# Patient Record
Sex: Male | Born: 1965 | Race: White | Hispanic: No | Marital: Married | Smoking: Never smoker
Health system: Southern US, Community
[De-identification: ages and names within clinical notes are randomized; demographics above are authoritative.]

## PROBLEM LIST (undated history)

## (undated) DIAGNOSIS — G4733 Obstructive sleep apnea (adult) (pediatric): Secondary | ICD-10-CM

## (undated) DIAGNOSIS — I10 Essential (primary) hypertension: Secondary | ICD-10-CM

## (undated) HISTORY — PX: LEG SURGERY: SHX1003

## (undated) HISTORY — DX: Essential (primary) hypertension: I10

## (undated) HISTORY — PX: FRACTURE SURGERY: SHX138

## (undated) HISTORY — DX: Obstructive sleep apnea (adult) (pediatric): G47.33

---

## 2002-03-20 ENCOUNTER — Inpatient Hospital Stay (HOSPITAL_COMMUNITY)
Admission: RE | Admit: 2002-03-20 | Discharge: 2002-04-02 | Payer: Self-pay | Admitting: Physical Medicine & Rehabilitation

## 2002-03-21 ENCOUNTER — Encounter: Payer: Self-pay | Admitting: Physical Medicine & Rehabilitation

## 2002-03-26 ENCOUNTER — Encounter: Payer: Self-pay | Admitting: Physical Medicine & Rehabilitation

## 2003-10-13 ENCOUNTER — Encounter: Admission: RE | Admit: 2003-10-13 | Discharge: 2004-01-11 | Payer: Self-pay | Admitting: Internal Medicine

## 2006-06-08 ENCOUNTER — Emergency Department: Payer: Self-pay | Admitting: Emergency Medicine

## 2006-06-08 ENCOUNTER — Other Ambulatory Visit: Payer: Self-pay

## 2008-03-25 ENCOUNTER — Encounter: Payer: Self-pay | Admitting: Endocrinology

## 2008-06-23 ENCOUNTER — Encounter: Payer: Self-pay | Admitting: Endocrinology

## 2008-07-03 ENCOUNTER — Encounter: Payer: Self-pay | Admitting: Endocrinology

## 2008-08-14 ENCOUNTER — Ambulatory Visit: Payer: Self-pay | Admitting: Endocrinology

## 2008-08-14 DIAGNOSIS — E213 Hyperparathyroidism, unspecified: Secondary | ICD-10-CM

## 2008-08-14 LAB — CONVERTED CEMR LAB
Calcium, Total (PTH): 9.7 mg/dL (ref 8.4–10.5)
PTH: 82.4 pg/mL — ABNORMAL HIGH (ref 14.0–72.0)
Prolactin: 4.7 ng/mL
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
## 2015-01-06 ENCOUNTER — Ambulatory Visit (INDEPENDENT_AMBULATORY_CARE_PROVIDER_SITE_OTHER): Payer: BLUE CROSS/BLUE SHIELD | Admitting: Internal Medicine

## 2015-01-06 ENCOUNTER — Encounter: Payer: Self-pay | Admitting: Internal Medicine

## 2015-01-06 VITALS — BP 134/90 | HR 69 | Temp 98.9°F | Wt 356.0 lb

## 2015-01-06 DIAGNOSIS — I10 Essential (primary) hypertension: Secondary | ICD-10-CM | POA: Insufficient documentation

## 2015-01-06 MED ORDER — LISINOPRIL-HYDROCHLOROTHIAZIDE 20-25 MG PO TABS: 1.0000 | ORAL_TABLET | Freq: Every day | ORAL | Status: DC

## 2015-01-06 MED ORDER — AMLODIPINE BESYLATE 5 MG PO TABS: 5.0000 mg | ORAL_TABLET | Freq: Every day | ORAL | Status: DC

## 2015-01-06 NOTE — Patient Instructions (Signed)

## 2015-01-06 NOTE — Assessment & Plan Note (Signed)
Encouraged him to work on diet and exercise 

## 2015-01-06 NOTE — Progress Notes (Signed)
HPI  Pt presents to the clinic today to establish care and for management of the conditions listed below. He is transferring care from Dr. Florene Glen in [place], [place].  Flu: never Tetanus: 2012 Vision Screening: yearly Dentist: yearly   HTN: BP well controlled on Norvasc, Tenormin and Lisinopril-HCT. BP today is 134/90. He is in need of medication refills today. He denies chest pain, chest tightness or shortness of breath.  Past Medical History  Diagnosis Date  . Hypertension     Current Outpatient Prescriptions  Medication Sig Dispense Refill  . amLODipine (NORVASC) 5 MG tablet Take 1 tablet by mouth daily.  0  . atenolol (TENORMIN) 25 MG tablet Take 1 tablet by mouth 2 (two) times daily.  4  . lisinopril-hydrochlorothiazide (PRINZIDE,ZESTORETIC) 20-25 MG per tablet Take 1 tablet by mouth daily.  1   No current facility-administered medications for this visit.    No Known Allergies  Family History  Problem Relation Age of Onset  . Hypertension Mother   . Hypertension Father   . Stroke Father     History   Social History  . Marital Status: Single    Spouse Name: N/A  . Number of Children: N/A  . Years of Education: N/A   Occupational History  . Not on file.   Social History Main Topics  . Smoking status: Never Smoker   . Smokeless tobacco: Never Used  . Alcohol Use: 0.0 oz/week    0 Standard drinks or equivalent per week     Comment: occasional  . Drug Use: No  . Sexual Activity: Not on file   Other Topics Concern  . Not on file   Social History Narrative  . No narrative on file    ROS:  Constitutional: Denies fever, malaise, fatigue, headache or abrupt weight changes.  Respiratory: Denies difficulty breathing, shortness of breath, cough or sputum production.   Cardiovascular: Denies chest pain, chest tightness, palpitations or swelling in the hands or feet.  Neurological: Denies dizziness, difficulty with memory, difficulty with speech or problems with  balance and coordination.   No other specific complaints in a complete review of systems (except as listed in HPI above).  PE:  BP 134/90 mmHg  Pulse 69  Temp(Src) 98.9 F (37.2 C) (Oral)  Wt 356 lb (161.481 kg)  SpO2 98% Wt Readings from Last 3 Encounters:  01/06/15 356 lb (161.481 kg)  08/14/08 387 lb (175.542 kg)    General: Appears his stated age, obese in NAD. HEENT: Head: normal shape and size; Eyes: sclera white, no icterus, conjunctiva pink, PERRLA and EOMs intact; Cardiovascular: Normal rate and rhythm. S1,S2 noted.  No murmur, rubs or gallops noted. No JVD or BLE edema. No carotid bruits noted. Pulmonary/Chest: Normal effort and positive vesicular breath sounds. No respiratory distress. No wheezes, rales or ronchi noted.  Neurological: Alert and oriented.  Psychiatric: Mood and affect normal. Behavior is normal. Judgment and thought content normal.     Assessment and Plan:

## 2015-01-06 NOTE — Assessment & Plan Note (Signed)
Well controlled on current meds Will check CBC and CMET today Medication refilled today  RTC in 6 months to follow up blood pressure

## 2015-01-07 LAB — COMPREHENSIVE METABOLIC PANEL
ALBUMIN: 4.2 g/dL (ref 3.5–5.2)
ALT: 25 U/L (ref 0–53)
AST: 21 U/L (ref 0–37)
Alkaline Phosphatase: 55 U/L (ref 39–117)
BUN: 14 mg/dL (ref 6–23)
CALCIUM: 9.8 mg/dL (ref 8.4–10.5)
CO2: 32 meq/L (ref 19–32)
Chloride: 102 mEq/L (ref 96–112)
Creatinine, Ser: 1.26 mg/dL (ref 0.40–1.50)
GFR: 65.02 mL/min (ref >60.00)
Glucose, Bld: 91 mg/dL (ref 70–99)
POTASSIUM: 3.4 meq/L — AB (ref 3.5–5.1)
SODIUM: 140 meq/L (ref 135–145)
TOTAL PROTEIN: 7.6 g/dL (ref 6.0–8.3)
Total Bilirubin: 0.6 mg/dL (ref 0.2–1.2)

## 2015-01-07 LAB — CBC
HEMATOCRIT: 42.4 % (ref 39.0–52.0)
Hemoglobin: 14.3 g/dL (ref 13.0–17.0)
MCHC: 33.7 g/dL (ref 30.0–36.0)
MCV: 83.2 fl (ref 78.0–100.0)
PLATELETS: 258 10*3/uL (ref 150.0–400.0)
RBC: 5.09 Mil/uL (ref 4.22–5.81)
RDW: 14.3 % (ref 11.5–15.5)
WBC: 5.2 10*3/uL (ref 4.0–10.5)

## 2015-05-12 ENCOUNTER — Other Ambulatory Visit: Payer: Self-pay

## 2015-05-12 MED ORDER — ATENOLOL 25 MG PO TABS: 25.0000 mg | ORAL_TABLET | Freq: Two times a day (BID) | ORAL | Status: DC

## 2015-07-13 ENCOUNTER — Ambulatory Visit: Payer: BLUE CROSS/BLUE SHIELD | Admitting: Internal Medicine

## 2015-07-15 ENCOUNTER — Telehealth: Payer: Self-pay | Admitting: Internal Medicine

## 2015-07-15 NOTE — Telephone Encounter (Signed)
Yes he needs to reschedule

## 2015-07-15 NOTE — Telephone Encounter (Signed)
Pt did not come in for their appt on 07/13/15 for a follow up. Please let me know if pt needs to be contacted immediately for follow up or no follow up needed. Best phone number to contact pt is [phone].

## 2015-07-17 ENCOUNTER — Telehealth: Payer: Self-pay | Admitting: Internal Medicine

## 2015-07-17 NOTE — Telephone Encounter (Signed)
Spoke with patient regarding rescheduling his 6 month follow up that was missed. He has scheduled one for the end of the month. He stated that he never received his lab results from his first appt then forgot about calling us about it. I let him know that a letter was sent to him with the results. He asked if someone could call him and give the the results over the phone. The best number is [phone].

## 2015-07-20 NOTE — Telephone Encounter (Signed)
Left detailed msg on VM per HIPAA  

## 2015-07-21 ENCOUNTER — Other Ambulatory Visit: Payer: Self-pay | Admitting: Internal Medicine

## 2015-07-22 MED ORDER — LISINOPRIL-HYDROCHLOROTHIAZIDE 20-25 MG PO TABS: 1.0000 | ORAL_TABLET | Freq: Every day | ORAL | Status: DC

## 2015-08-03 ENCOUNTER — Telehealth: Payer: Self-pay | Admitting: Internal Medicine

## 2015-08-03 ENCOUNTER — Ambulatory Visit: Payer: BLUE CROSS/BLUE SHIELD | Admitting: Internal Medicine

## 2015-08-03 NOTE — Telephone Encounter (Signed)
Yes, he needs to follow up.

## 2015-08-03 NOTE — Telephone Encounter (Signed)
Pt did not come in for their appt today for follow up. Please let me know if pt needs to be contacted immediately for follow up or no follow up needed. Best phone number to contact pt is [phone].

## 2015-08-06 NOTE — Telephone Encounter (Signed)
Spoke with patient, he currently is on the road working and will call back to reschedule the appt. He has no insurance, he is aware of $80 upfront payment with possible additional fee that will be mailed to him after the 55% discount.

## 2015-09-21 ENCOUNTER — Other Ambulatory Visit: Payer: Self-pay | Admitting: Internal Medicine

## 2015-12-29 ENCOUNTER — Other Ambulatory Visit: Payer: Self-pay | Admitting: Internal Medicine

## 2016-01-25 ENCOUNTER — Other Ambulatory Visit: Payer: Self-pay | Admitting: Internal Medicine

## 2016-03-05 ENCOUNTER — Other Ambulatory Visit: Payer: Self-pay | Admitting: Internal Medicine

## 2016-03-07 ENCOUNTER — Telehealth: Payer: Self-pay | Admitting: Internal Medicine

## 2016-03-07 NOTE — Telephone Encounter (Signed)
Left detailed message on voicemail [facility]) that patient needs to call the office and schedule a follow-up appointment.

## 2016-03-07 NOTE — Telephone Encounter (Signed)
Pt called stating he received a $50 no show bill He stated he called and told something and to cancel appointment and he still received this bill Can anything be done about this

## 2016-03-07 NOTE — Telephone Encounter (Signed)
Pt returned you call.  He stated he would have to wait till he got his bill straightened out.  Sent message to adrienne to help pt with bill

## 2016-03-07 NOTE — Telephone Encounter (Signed)
See phone note. Is it okay to refill medication?

## 2016-03-09 NOTE — Telephone Encounter (Signed)
Rose, Could you work on this please? Thanks!

## 2016-03-14 NOTE — Telephone Encounter (Signed)
It's okay for both this once, but please call the patient and let him know we can't write anymore off if it happens in the future.

## 2016-03-14 NOTE — Telephone Encounter (Signed)
I have taken care of this and notified patient.  Also, let him no we will not be able to write off future no show apptmts.  Thank you!

## 2016-03-14 NOTE — Telephone Encounter (Signed)
Vincente Liberty, Mr. Derrel Nip has TWO no shows in the month of November (07/14/2015 & 08/03/2015), do you want just one written off or both?  Or since he had two in one month do you want any written off? Thank you!!

## 2016-07-14 ENCOUNTER — Other Ambulatory Visit: Payer: Self-pay | Admitting: Internal Medicine

## 2016-10-03 ENCOUNTER — Telehealth: Payer: Self-pay | Admitting: Internal Medicine

## 2016-10-03 NOTE — Telephone Encounter (Signed)
Fine with me. I have only seen him once

## 2016-10-03 NOTE — Telephone Encounter (Signed)
Okay with me if okay with Baity.   Would need OV to discuss colonoscopy.  Thanks.

## 2016-10-03 NOTE — Telephone Encounter (Signed)
Pt says that he would like to switch providers from [facility] to Dr. Damita Dunnings.   Is this switch okay?    ALSO, pt says that he would like to have a colonoscopy.     Please advise.

## 2016-10-11 ENCOUNTER — Encounter: Payer: Self-pay | Admitting: Family Medicine

## 2016-10-11 ENCOUNTER — Ambulatory Visit (INDEPENDENT_AMBULATORY_CARE_PROVIDER_SITE_OTHER): Payer: BC Managed Care – PPO | Admitting: Family Medicine

## 2016-10-11 VITALS — BP 130/90 | HR 74 | Temp 98.5°F | Resp 18 | Ht 73.0 in | Wt 365.0 lb

## 2016-10-11 DIAGNOSIS — Z7189 Other specified counseling: Secondary | ICD-10-CM | POA: Diagnosis not present

## 2016-10-11 DIAGNOSIS — Z9989 Dependence on other enabling machines and devices: Secondary | ICD-10-CM

## 2016-10-11 DIAGNOSIS — I10 Essential (primary) hypertension: Secondary | ICD-10-CM

## 2016-10-11 DIAGNOSIS — G4733 Obstructive sleep apnea (adult) (pediatric): Secondary | ICD-10-CM | POA: Insufficient documentation

## 2016-10-11 LAB — COMPREHENSIVE METABOLIC PANEL
ALBUMIN: 4.5 g/dL (ref 3.5–5.2)
ALK PHOS: 74 U/L (ref 39–117)
ALT: 30 U/L (ref 0–53)
AST: 20 U/L (ref 0–37)
BUN: 11 mg/dL (ref 6–23)
CALCIUM: 9.5 mg/dL (ref 8.4–10.5)
CHLORIDE: 104 meq/L (ref 96–112)
CO2: 30 mEq/L (ref 19–32)
Creatinine, Ser: 1.14 mg/dL (ref 0.40–1.50)
GFR: 72.44 mL/min (ref >60.00)
Glucose, Bld: 111 mg/dL — ABNORMAL HIGH (ref 70–99)
Potassium: 3.9 mEq/L (ref 3.5–5.1)
Sodium: 140 mEq/L (ref 135–145)
TOTAL PROTEIN: 7.4 g/dL (ref 6.0–8.3)
Total Bilirubin: 0.6 mg/dL (ref 0.2–1.2)

## 2016-10-11 LAB — LIPID PANEL
CHOLESTEROL: 191 mg/dL (ref 0–200)
HDL: 62.1 mg/dL (ref >39.00)
LDL Cholesterol: 114 mg/dL — ABNORMAL HIGH (ref 0–99)
NonHDL: 128.41
TRIGLYCERIDES: 73 mg/dL (ref 0.0–149.0)
Total CHOL/HDL Ratio: 3
VLDL: 14.6 mg/dL (ref 0.0–40.0)

## 2016-10-11 MED ORDER — AMLODIPINE BESYLATE 5 MG PO TABS: ORAL_TABLET | ORAL | 3 refills | Status: DC

## 2016-10-11 MED ORDER — LISINOPRIL 20 MG PO TABS: 20.0000 mg | ORAL_TABLET | Freq: Every day | ORAL | 3 refills | Status: DC

## 2016-10-11 MED ORDER — METOPROLOL TARTRATE 25 MG PO TABS: 25.0000 mg | ORAL_TABLET | Freq: Every day | ORAL | 3 refills | Status: DC

## 2016-10-11 NOTE — Assessment & Plan Note (Signed)
Check labs.  D/w pt about diet and exercise, handout given re: low sugar diet, d/w pt.  No change in meds.  Continue work on weight loss.  Continue CPAP for OSA.  >25 minutes spent in face to face time with patient, >50% spent in counselling or coordination of care.

## 2016-10-11 NOTE — Assessment & Plan Note (Signed)
Continue use 

## 2016-10-11 NOTE — Progress Notes (Signed)
Pre visit review using our clinic review tool, if applicable. No additional management support is needed unless otherwise documented below in the visit note. 

## 2016-10-11 NOTE — Patient Instructions (Addendum)
Go to the lab on the way out.  We'll contact you with your lab report. Check to see when you had a tetanus shot.  Let us know.   Low sugar diet, keep exercising.  Take care.  Glad to see you.  I would get a flu shot each fall.

## 2016-10-11 NOTE — Progress Notes (Signed)
Hypertension:    Using medication without problems or lightheadedness: yes Chest pain with exertion:no Edema:no Short of breath:no Average home BPs: usually ~120s/80s Playing weight ~280 lbs with football.  D/w pt about diet and exercise.  Due for labs.   HIV screen prev done, ~1999.  D/w pt.    Flu shot d/w pt.  Encouraged.    H/o OSA on CPAP now, not snoring with that.  Compliant.    Living will d/w pt.  Wife designated if patient were incapacitated, then patient's mother next in line if needed.    Meds, vitals, and allergies reviewed.   PMH and SH reviewed  ROS: Per HPI unless specifically indicated in ROS section   GEN: nad, alert and oriented HEENT: mucous membranes moist NECK: supple w/o LA CV: rrr. PULM: ctab, no inc wob ABD: soft, +bs EXT: no edema SKIN: no acute rash

## 2016-10-13 ENCOUNTER — Other Ambulatory Visit: Payer: Self-pay | Admitting: Family Medicine

## 2016-10-13 ENCOUNTER — Encounter: Payer: Self-pay | Admitting: Family Medicine

## 2016-10-13 DIAGNOSIS — R739 Hyperglycemia, unspecified: Secondary | ICD-10-CM

## 2017-04-12 ENCOUNTER — Other Ambulatory Visit (INDEPENDENT_AMBULATORY_CARE_PROVIDER_SITE_OTHER): Payer: BC Managed Care – PPO

## 2017-04-12 DIAGNOSIS — R739 Hyperglycemia, unspecified: Secondary | ICD-10-CM

## 2017-04-12 LAB — HEMOGLOBIN A1C: HEMOGLOBIN A1C: 6.2 % (ref 4.6–6.5)

## 2017-04-17 ENCOUNTER — Ambulatory Visit (INDEPENDENT_AMBULATORY_CARE_PROVIDER_SITE_OTHER): Payer: BC Managed Care – PPO | Admitting: Family Medicine

## 2017-04-17 ENCOUNTER — Encounter: Payer: Self-pay | Admitting: Family Medicine

## 2017-04-17 VITALS — BP 130/90 | HR 75 | Temp 98.4°F | Wt 365.2 lb

## 2017-04-17 DIAGNOSIS — Z1211 Encounter for screening for malignant neoplasm of colon: Secondary | ICD-10-CM | POA: Diagnosis not present

## 2017-04-17 DIAGNOSIS — R739 Hyperglycemia, unspecified: Secondary | ICD-10-CM

## 2017-04-17 DIAGNOSIS — I10 Essential (primary) hypertension: Secondary | ICD-10-CM | POA: Diagnosis not present

## 2017-04-17 NOTE — Assessment & Plan Note (Signed)
D/w pt about weight loss, initial goal <300 lbs.  Refer to nutrition.  He agrees.

## 2017-04-17 NOTE — Assessment & Plan Note (Signed)
Hyperglycemia.  A1c 6.2. dw pt.  Needs weight loss.  A1c/hyperglycemia path/phys d/w pt.  Refer to nutrition.  Recheck labs prior to CPE in about 6 months.  He agrees.

## 2017-04-17 NOTE — Assessment & Plan Note (Signed)
Continue current meds.  Needs weight loss.

## 2017-04-17 NOTE — Patient Instructions (Signed)
Rosaria Ferries will call about your referral to nutrition.  See if your insurance will cover it.   Think about colon cancer screening- the plain stool blood test, the stool test with blood/DNA testing (cologuard), or a colonoscopy.  See if your insurance will cover the cologuard test.  Let me know if you need help getting the testing set up.   Take care.  Glad to see you.  Keep working on your weight.  Recheck labs prior to a physical in about 6 months.  Update me as needed.

## 2017-04-17 NOTE — Progress Notes (Signed)
Hypertension:    Using medication without problems or lightheadedness: yes Chest pain with exertion:no Edema:not except with prolonged sitting on a cross country drive- that resolved with more activity and diet improvement/less salt.   Short of breath:no  Hyperglycemia.  A1c 6.2. dw pt.  Needs weight loss.  A1c/hyperglycemia path/phys d/w pt.    D/w patient LY:[id number] for colon cancer screening, including IFOB vs. Colonoscopy vs cologuard.  Risks and benefits of both were discussed and patient voiced understanding.  No FH.  Pt elects to consider options.    Meds, vitals, and allergies reviewed.   PMH and SH reviewed  ROS: Per HPI unless specifically indicated in ROS section   GEN: nad, alert and oriented HEENT: mucous membranes moist NECK: supple w/o LA CV: rrr. PULM: ctab, no inc wob ABD: soft, +bs EXT: no edema

## 2017-04-17 NOTE — Assessment & Plan Note (Signed)
D/w patient Steven Blankenship for colon cancer screening, including IFOB vs. Colonoscopy vs cologuard.  Risks and benefits of both were discussed and patient voiced understanding.  No FH.  Pt elects to consider options.   He'll update me, see AVS.

## 2017-06-21 ENCOUNTER — Telehealth: Payer: Self-pay | Admitting: Family Medicine
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
## 2017-06-21 NOTE — Telephone Encounter (Signed)
Noted, please get him in for UC follow up after evaluated.

## 2017-06-21 NOTE — Telephone Encounter (Signed)
I spoke with pt and he is waiting on his wife to take him to UC today.

## 2017-06-27 ENCOUNTER — Telehealth: Payer: Self-pay | Admitting: Family Medicine

## 2017-06-27 NOTE — Telephone Encounter (Signed)
See notes in EMR. I thought he went to urgent care. Please see what detail she can get from patient. Thanks.

## 2017-06-27 NOTE — Telephone Encounter (Signed)
Copied from [place] #[id number]. Topic: Referral - Status >> Jun 26, 2017 11:26 AM Steven Blankenship wrote: Reason for CRM: spoke with office last week on a referral for orthopedic doctor. Wanting to know what doc he needs to schedule an appt with.

## 2017-06-28 NOTE — Telephone Encounter (Addendum)
Spoke to patient and was advised that he did not call this office this week. Patient stated that he went to [facility] and they referred him to an orthopedic doctor. Patient stated that he does not know why we got this message. Patient stated that [facility] walk-in clinic referred him to an orthopedist. Patient stated that they scheduled him an appointment with ortho on Wednesday. Patient stated that he called the number that was listed on his discharge paper for the orthopedist office Monday to tell them he needed to reschedule his appointment for Wednesday. Patient stated that their call center told him that they would have the orthopedist office call him back. Patient stated that he never got a call back from them. See other phone note.

## 2017-06-28 NOTE — Telephone Encounter (Signed)
Spoke to patient and was advised that he went to the walk-in clinic at [facility] clinic and they referred him to an orthopedist. Patient stated that he called the number on his discharge paper Monday to let them know that he could not make the appointment scheduled Wednesday with the orthopedist and no one called him back. Patients stated that he was working today and could not make that appointment and that is why he called Monday to reschedule.  Patient stated that his appointment was scheduled with a doctor that is located on [facility].. Patient stated that he called Monday and they were suppose to have the orthopedist office call him back.  Monday.[place]. Patient stated that he was referred because of his shoulder.

## 2017-06-29 NOTE — Telephone Encounter (Signed)
Patient stated that he is still trying to get in contact with them.

## 2017-06-29 NOTE — Telephone Encounter (Signed)
I don't think there is anything for [place] to do at this point. I would have him contact the UC or the ortho clinic about the referral.  If he still can't get set up, then let us know.  Thanks.

## 2017-08-01 ENCOUNTER — Telehealth: Payer: Self-pay | Admitting: Family Medicine

## 2017-08-01 NOTE — Telephone Encounter (Signed)
Called [facility] to check on referral that was placed in August. Patient scheduled,then cancelled,rescheduled then cancelled . He told [facility] he was not interested in the classes. I am cancelling the referral.

## 2017-08-02 NOTE — Telephone Encounter (Signed)
Noted. Thanks for trying.

## 2017-11-13 ENCOUNTER — Ambulatory Visit: Payer: BLUE CROSS/BLUE SHIELD | Admitting: Family Medicine

## 2017-11-13 ENCOUNTER — Encounter: Payer: Self-pay | Admitting: Family Medicine

## 2017-11-13 VITALS — BP 116/84 | HR 91 | Temp 98.7°F | Wt 367.2 lb

## 2017-11-13 DIAGNOSIS — I10 Essential (primary) hypertension: Secondary | ICD-10-CM

## 2017-11-13 DIAGNOSIS — Z8249 Family history of ischemic heart disease and other diseases of the circulatory system: Secondary | ICD-10-CM

## 2017-11-13 DIAGNOSIS — Z9989 Dependence on other enabling machines and devices: Secondary | ICD-10-CM | POA: Diagnosis not present

## 2017-11-13 DIAGNOSIS — J069 Acute upper respiratory infection, unspecified: Secondary | ICD-10-CM | POA: Diagnosis not present

## 2017-11-13 DIAGNOSIS — G4733 Obstructive sleep apnea (adult) (pediatric): Secondary | ICD-10-CM

## 2017-11-13 DIAGNOSIS — R739 Hyperglycemia, unspecified: Secondary | ICD-10-CM | POA: Diagnosis not present

## 2017-11-13 LAB — COMPREHENSIVE METABOLIC PANEL
ALBUMIN: 4.2 g/dL (ref 3.5–5.2)
ALT: 43 U/L (ref 0–53)
AST: 26 U/L (ref 0–37)
Alkaline Phosphatase: 56 U/L (ref 39–117)
BUN: 12 mg/dL (ref 6–23)
CALCIUM: 9.6 mg/dL (ref 8.4–10.5)
CHLORIDE: 104 meq/L (ref 96–112)
CO2: 30 meq/L (ref 19–32)
Creatinine, Ser: 1.03 mg/dL (ref 0.40–1.50)
GFR: 81.08 mL/min (ref >60.00)
Glucose, Bld: 99 mg/dL (ref 70–99)
Potassium: 3.7 mEq/L (ref 3.5–5.1)
Sodium: 139 mEq/L (ref 135–145)
Total Bilirubin: 0.6 mg/dL (ref 0.2–1.2)
Total Protein: 7.2 g/dL (ref 6.0–8.3)

## 2017-11-13 LAB — HEMOGLOBIN A1C: Hgb A1c MFr Bld: 6.1 % (ref 4.6–6.5)

## 2017-11-13 LAB — LIPID PANEL
CHOL/HDL RATIO: 3
CHOLESTEROL: 170 mg/dL (ref 0–200)
HDL: 57.5 mg/dL (ref >39.00)
LDL Cholesterol: 96 mg/dL (ref 0–99)
NonHDL: 112.22
TRIGLYCERIDES: 81 mg/dL (ref 0.0–149.0)
VLDL: 16.2 mg/dL (ref 0.0–40.0)

## 2017-11-13 MED ORDER — LISINOPRIL 20 MG PO TABS: 20.0000 mg | ORAL_TABLET | Freq: Every day | ORAL | 3 refills | Status: DC

## 2017-11-13 MED ORDER — METOPROLOL TARTRATE 25 MG PO TABS: 25.0000 mg | ORAL_TABLET | Freq: Every day | ORAL | 3 refills | Status: DC

## 2017-11-13 MED ORDER — AMLODIPINE BESYLATE 5 MG PO TABS: ORAL_TABLET | ORAL | 3 refills | Status: DC

## 2017-11-13 MED ORDER — ALBUTEROL SULFATE HFA 108 (90 BASE) MCG/ACT IN AERS: 1.0000 | INHALATION_SPRAY | Freq: Four times a day (QID) | RESPIRATORY_TRACT | 1 refills | Status: DC | PRN

## 2017-11-13 MED ORDER — ALBUTEROL SULFATE HFA 108 (90 BASE) MCG/ACT IN AERS: 1.0000 | INHALATION_SPRAY | Freq: Four times a day (QID) | RESPIRATORY_TRACT | Status: DC | PRN

## 2017-11-13 NOTE — Patient Instructions (Signed)
Go to the lab on the way out.  We'll contact you with your lab report. Schedule a physical for the fall.  Update me as needed.  Take care.  Glad to see you.

## 2017-11-13 NOTE — Progress Notes (Signed)
Hypertension:    Using medication without problems or lightheadedness: yes Chest pain with exertion:no Edema:no Short of breath: no Other issues: still on CPAP for OSA at baseline until recent URI vs allergy sx.  Cough, sputum, runny nose.  Some occ wheeze.  Had used SABA with some help with the wheeze.  Drinking plenty of fluids.  No fever.  He feels some better now.   He has CPAP to restart when possible.    Flu shot declined.    D/w pt about weight, he is cutting out carbs.  He had L shoulder injury with lifting and that affected his exercise.  He is getting back to using a treadmill.  His shoulder is better.  He is back on his diet.    His brother had a DVT.  His brother is a driver and wasn't getting up to [employer].  Patient does get up and move around with scheduled breaks.  Patient has no personal history of DVT.  No other FH.   Discussed with patient, it would not appear that patient needs hypercoagulable workup at this point.  Meds, vitals, and allergies reviewed.   PMH and SH reviewed  ROS: Per HPI unless specifically indicated in ROS section   GEN: nad, alert and oriented HEENT: mucous membranes moist, tm w/o erythema, nasal exam w/o erythema, clear discharge noted,  OP with cobblestoning NECK: supple w/o LA CV: rrr.   PULM: ctab, no inc wob EXT: no edema SKIN: no acute rash  Labs pending.

## 2017-11-14 ENCOUNTER — Encounter: Payer: Self-pay | Admitting: Family Medicine

## 2017-11-14 DIAGNOSIS — Z8249 Family history of ischemic heart disease and other diseases of the circulatory system: Secondary | ICD-10-CM | POA: Insufficient documentation

## 2017-11-14 DIAGNOSIS — J069 Acute upper respiratory infection, unspecified: Secondary | ICD-10-CM | POA: Insufficient documentation

## 2017-11-14 NOTE — Assessment & Plan Note (Signed)
Continue current medications.  Check routine labs today.  Discussed with patient about diet and exercise.  See notes and labs.

## 2017-11-14 NOTE — Assessment & Plan Note (Signed)
Discussed with patient about diet and exercise.  See above.

## 2017-11-14 NOTE — Assessment & Plan Note (Signed)
Restart CPAP.  Had been compliant.  Only stopped use when he had recent URI symptoms.  Discussed.

## 2017-11-14 NOTE — Assessment & Plan Note (Signed)
His brother had a DVT.  His brother is a driver and wasn't getting up to [employer].  Patient does get up and move around with scheduled breaks.  Patient has no personal history of DVT.  No other FH.   Discussed with patient, it would not appear that patient needs hypercoagulable workup at this point.

## 2017-11-14 NOTE — Assessment & Plan Note (Signed)
Likely viral, improving.  Nontoxic.  Okay for outpatient follow-up.  Lungs are clear.  Restart CPAP.  No need for antibiotics.  Rationale discussed with patient.

## 2017-11-28 ENCOUNTER — Telehealth: Payer: Self-pay | Admitting: Family Medicine

## 2017-11-28 NOTE — Telephone Encounter (Signed)
Form transported to Dr. Josefine Class In [place].

## 2017-11-28 NOTE — Telephone Encounter (Signed)
Form faxed and scanned.

## 2017-11-28 NOTE — Telephone Encounter (Signed)
Form done.  Thanks.  Please scan the papers before given to patient.

## 2017-11-28 NOTE — Telephone Encounter (Signed)
Pt dropped off form of medical necessity for his CPAP machine. Needs form filled out ASAP. Placed in Rx tower.

## 2018-04-08 ENCOUNTER — Other Ambulatory Visit: Payer: Self-pay | Admitting: Family Medicine

## 2018-04-08 DIAGNOSIS — R739 Hyperglycemia, unspecified: Secondary | ICD-10-CM

## 2018-04-08 DIAGNOSIS — I10 Essential (primary) hypertension: Secondary | ICD-10-CM

## 2018-04-11 ENCOUNTER — Other Ambulatory Visit: Payer: Self-pay

## 2018-04-16 ENCOUNTER — Encounter: Payer: BLUE CROSS/BLUE SHIELD | Admitting: Family Medicine

## 2018-05-22 ENCOUNTER — Emergency Department: Payer: BLUE CROSS/BLUE SHIELD

## 2018-05-22 ENCOUNTER — Emergency Department
Admission: EM | Admit: 2018-05-22 | Discharge: 2018-05-22 | Disposition: A | Discharge status: Home / Self Care | Payer: BLUE CROSS/BLUE SHIELD | Attending: Emergency Medicine | Admitting: Emergency Medicine

## 2018-05-22 ENCOUNTER — Other Ambulatory Visit: Payer: Self-pay

## 2018-05-22 DIAGNOSIS — S3992XA Unspecified injury of lower back, initial encounter: Secondary | ICD-10-CM | POA: Diagnosis present

## 2018-05-22 DIAGNOSIS — Y998 Other external cause status: Secondary | ICD-10-CM | POA: Insufficient documentation

## 2018-05-22 DIAGNOSIS — Y939 Activity, unspecified: Secondary | ICD-10-CM | POA: Insufficient documentation

## 2018-05-22 DIAGNOSIS — I10 Essential (primary) hypertension: Secondary | ICD-10-CM | POA: Diagnosis not present

## 2018-05-22 DIAGNOSIS — W08XXXA Fall from other furniture, initial encounter: Secondary | ICD-10-CM | POA: Diagnosis not present

## 2018-05-22 DIAGNOSIS — Y929 Unspecified place or not applicable: Secondary | ICD-10-CM | POA: Diagnosis not present

## 2018-05-22 DIAGNOSIS — Z79899 Other long term (current) drug therapy: Secondary | ICD-10-CM | POA: Diagnosis not present

## 2018-05-22 DIAGNOSIS — S300XXA Contusion of lower back and pelvis, initial encounter: Secondary | ICD-10-CM | POA: Diagnosis not present

## 2018-05-22 MED ORDER — TRAMADOL HCL 50 MG PO TABS: 50.0000 mg | ORAL_TABLET | Freq: Four times a day (QID) | ORAL | 0 refills | Status: DC | PRN

## 2018-05-22 MED ORDER — CYCLOBENZAPRINE HCL 10 MG PO TABS: 10.0000 mg | ORAL_TABLET | Freq: Three times a day (TID) | ORAL | 0 refills | Status: DC | PRN

## 2018-05-22 NOTE — ED Triage Notes (Signed)
Pt arrives via POV. Pt states fell off stool on sunday hitting head on concrete. Pt denies any dizziness, nausea, or visual changes. Pt states head currently not hurting and right lower back is pain/stiff is main concern. NAD noted at this time

## 2018-05-22 NOTE — Discharge Instructions (Signed)
Follow discharge care instruction take medication as directed. °
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
## 2018-06-18 ENCOUNTER — Telehealth: Payer: Self-pay

## 2018-06-18 DIAGNOSIS — Z1211 Encounter for screening for malignant neoplasm of colon: Secondary | ICD-10-CM

## 2018-06-18 NOTE — Telephone Encounter (Signed)
Pt last seen 11/13/17 for med refill. Per health maintenance colonoscopy is overdue since 05/11/17.

## 2018-06-18 NOTE — Telephone Encounter (Signed)
Copied from [place] [id number]. Topic: General - Other >> Jun 18, 2018  1:11 PM Cecelia Byars, NT wrote: Reason for CRM: Patient says he had a screening  for a colonoscopy and would like to reschedule so he can schedule  the procedure  please call him at [phone] to give him the information.

## 2018-06-19 NOTE — Telephone Encounter (Signed)
I spoke with pt; pt said he was scheduled for colonoscopy on 06/04/18 and could not go for that appt; pt cancelled appt and was supposed to cb to reschedule. Pt does not know who called to schedule the colonoscopy appt; pt thought was [facility] but not sure. Pt per health maintenance was due colonoscopy in 2018. Pt is not having any problems or symptoms. Pt request colonoscopy to be done in [place].Please advise.

## 2018-06-19 NOTE — Telephone Encounter (Signed)
Please talk to me about this.  Please help me understand the situation.  Thanks.

## 2018-06-20 NOTE — Telephone Encounter (Signed)
Referral ordered.  Thanks

## 2018-06-20 NOTE — Telephone Encounter (Signed)
Tried to call patient and unable to leave message because mail box was full. Will have to try and call again later.

## 2018-06-21 ENCOUNTER — Other Ambulatory Visit: Payer: Self-pay

## 2018-06-21 DIAGNOSIS — Z1211 Encounter for screening for malignant neoplasm of colon: Secondary | ICD-10-CM

## 2018-06-22 ENCOUNTER — Other Ambulatory Visit: Payer: Self-pay

## 2018-06-22 NOTE — Telephone Encounter (Signed)
Spoke with patient and verified he has all information needed and appointment has been set up for 07/16/18.

## 2018-07-09 ENCOUNTER — Other Ambulatory Visit: Payer: Self-pay

## 2018-07-09 MED ORDER — PEG 3350-KCL-NA BICARB-NACL 420 G PO SOLR: 4000.0000 mL | Freq: Once | ORAL | 0 refills | Status: AC

## 2018-07-16 ENCOUNTER — Encounter: Payer: Self-pay | Admitting: *Deleted

## 2018-07-16 ENCOUNTER — Encounter
Admission: RE | Disposition: A | Discharge status: Home / Self Care | Payer: Self-pay | Source: Ambulatory Visit | Attending: Gastroenterology

## 2018-07-16 ENCOUNTER — Ambulatory Visit: Payer: BLUE CROSS/BLUE SHIELD | Admitting: Certified Registered Nurse Anesthetist

## 2018-07-16 ENCOUNTER — Ambulatory Visit
Admission: RE | Admit: 2018-07-16 | Discharge: 2018-07-16 | Disposition: A | Discharge status: Home / Self Care | Payer: BLUE CROSS/BLUE SHIELD | Source: Ambulatory Visit | Attending: Gastroenterology | Admitting: Gastroenterology

## 2018-07-16 DIAGNOSIS — Z79899 Other long term (current) drug therapy: Secondary | ICD-10-CM | POA: Diagnosis not present

## 2018-07-16 DIAGNOSIS — K648 Other hemorrhoids: Secondary | ICD-10-CM | POA: Insufficient documentation

## 2018-07-16 DIAGNOSIS — Z1211 Encounter for screening for malignant neoplasm of colon: Secondary | ICD-10-CM

## 2018-07-16 DIAGNOSIS — D122 Benign neoplasm of ascending colon: Secondary | ICD-10-CM | POA: Diagnosis not present

## 2018-07-16 DIAGNOSIS — J449 Chronic obstructive pulmonary disease, unspecified: Secondary | ICD-10-CM | POA: Diagnosis not present

## 2018-07-16 DIAGNOSIS — I10 Essential (primary) hypertension: Secondary | ICD-10-CM | POA: Insufficient documentation

## 2018-07-16 DIAGNOSIS — K621 Rectal polyp: Secondary | ICD-10-CM | POA: Diagnosis not present

## 2018-07-16 DIAGNOSIS — Z6841 Body Mass Index (BMI) 40.0 and over, adult: Secondary | ICD-10-CM | POA: Diagnosis not present

## 2018-07-16 DIAGNOSIS — G4733 Obstructive sleep apnea (adult) (pediatric): Secondary | ICD-10-CM | POA: Insufficient documentation

## 2018-07-16 HISTORY — PX: COLONOSCOPY WITH PROPOFOL: SHX5780

## 2018-07-16 SURGERY — COLONOSCOPY WITH PROPOFOL
Anesthesia: General

## 2018-07-16 MED ORDER — METOPROLOL SUCCINATE ER 25 MG PO TB24
ORAL_TABLET | ORAL | Status: AC
  Administered 2018-07-16: 25 mg via ORAL
  Filled 2018-07-16: qty 1

## 2018-07-16 MED ORDER — SODIUM CHLORIDE 0.9 % IV SOLN
INTRAVENOUS | Status: DC
  Administered 2018-07-16: 1000 mL via INTRAVENOUS

## 2018-07-16 MED ORDER — PROPOFOL 10 MG/ML IV BOLUS
INTRAVENOUS | Status: DC | PRN
  Administered 2018-07-16: 40 mg via INTRAVENOUS
  Administered 2018-07-16: 100 mg via INTRAVENOUS

## 2018-07-16 MED ORDER — PROPOFOL 500 MG/50ML IV EMUL
INTRAVENOUS | Status: DC | PRN
  Administered 2018-07-16: 160 ug/kg/min via INTRAVENOUS

## 2018-07-16 MED ORDER — METOPROLOL TARTRATE 25 MG PO TABS: 25.0000 mg | ORAL_TABLET | ORAL | Status: DC

## 2018-07-16 NOTE — Anesthesia Post-op Follow-up Note (Signed)
Anesthesia QCDR form completed.        

## 2018-07-16 NOTE — Transfer of Care (Signed)
Immediate Anesthesia Transfer of Care Note  Patient: Colon Flattery III  Procedure(s) Performed: COLONOSCOPY WITH PROPOFOL (N/A )  Patient Location: PACU  Anesthesia Type:General  Level of Consciousness: drowsy  Airway & Oxygen Therapy: Patient Spontanous Breathing and Patient connected to nasal cannula oxygen  Post-op Assessment: Report given to RN and Post -op Vital signs reviewed and stable  Post vital signs: Reviewed and stable  Last Vitals:  Vitals Value Taken Time  BP 148/112 07/16/2018 11:13 AM  Temp    Pulse 81 07/16/2018 11:15 AM  Resp 18 07/16/2018 11:15 AM  SpO2 99 % 07/16/2018 11:15 AM  Vitals shown include unvalidated device data.  Last Pain:  Vitals:   07/16/18 1104  TempSrc:   PainSc: 0-No pain      Patients Stated Pain Goal: 0 (37/90/24 0973)  Complications: None
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
## 2018-07-16 NOTE — Op Note (Signed)
[facility] Patient Name: Steven Blankenship [facility] Procedure Date: 07/16/2018 10:17 AM MRN: [id number] Account #: [id number] Date of Birth: 05-09-1966 Admit Type: Outpatient Age: 52 Room: [facility] ENDO ROOM 4 Gender: Male Note Status: Finalized Procedure:            Colonoscopy Indications:          Screening for colorectal malignant neoplasm, This is                        the patient's first colonoscopy Providers:            Lin Landsman MD, MD Referring MD:         Elveria Rising. Damita Dunnings, MD (Referring MD) Medicines:            Monitored Anesthesia Care Complications:        No immediate complications. Estimated blood loss: None. Procedure:            Pre-Anesthesia Assessment:                       - Prior to the procedure, a History and Physical was                        performed, and patient medications and allergies were                        reviewed. The patient is competent. The risks and                        benefits of the procedure and the sedation options and                        risks were discussed with the patient. All questions                        were answered and informed consent was obtained.                        Patient identification and proposed procedure were                        verified by the physician, the nurse, the                        anesthesiologist, the anesthetist and the technician in                        the pre-procedure area in the procedure room in the                        endoscopy suite. Mental Status Examination: alert and                        oriented. Airway Examination: normal oropharyngeal                        airway and neck mobility. Respiratory Examination:                        clear to auscultation. CV Examination: normal.  Prophylactic Antibiotics: The patient does not require                        prophylactic antibiotics. Prior Anticoagulants: The                patient has taken aspirin, last dose was 2 days prior                        to procedure. ASA Grade Assessment: III - A patient                        with severe systemic disease. After reviewing the risks                        and benefits, the patient was deemed in satisfactory                        condition to undergo the procedure. The anesthesia plan                        was to use monitored anesthesia care (MAC). Immediately                        prior to administration of medications, the patient was                        re-assessed for adequacy to receive sedatives. The                        heart rate, respiratory rate, oxygen saturations, blood                        pressure, adequacy of pulmonary ventilation, and                        response to care were monitored throughout the                        procedure. The physical status of the patient was                        re-assessed after the procedure.                       After obtaining informed consent, the colonoscope was                        passed under direct vision. Throughout the procedure,                        the patient's blood pressure, pulse, and oxygen                        saturations were monitored continuously. The                        Colonoscope was introduced through the anus and                        advanced to the the cecum, identified  by appendiceal                        orifice and ileocecal valve. The colonoscopy was                        performed with moderate difficulty due to significant                        looping and the patient's body habitus. Successful                        completion of the procedure was aided by changing the                        patient to a prone position and applying abdominal                        pressure. The patient tolerated the procedure well. The                        quality of the bowel preparation was evaluated  using                        the BBPS [facility] Bowel Preparation Scale) with scores                        of: Right Colon = 3, Transverse Colon = 3 and Left                        Colon = 3 (entire mucosa seen well with no residual                        staining, small fragments of stool or opaque liquid).                        The total BBPS score equals 9. Findings:      The perianal and digital rectal examinations were normal. Pertinent       negatives include normal sphincter tone and no palpable rectal lesions.      A 6 mm polyp was found in the ascending colon. The polyp was sessile.       The polyp was removed with a cold snare. Resection and retrieval were       complete.      A diminutive polyp was found in the rectum. The polyp was sessile. The       polyp was removed with a cold biopsy forceps. Resection and retrieval       were complete.      Non-bleeding internal hemorrhoids were found during retroflexion. The       hemorrhoids were moderate. Impression:           - One 6 mm polyp in the ascending colon, removed with a                        cold snare. Resected and retrieved.                       - One diminutive polyp in the rectum, removed with a  cold biopsy forceps. Resected and retrieved.                       - Non-bleeding internal hemorrhoids. Recommendation:       - Discharge patient to home (with escort).                       - Resume previous diet today.                       - Continue present medications.                       - Await pathology results.                       - Repeat colonoscopy in 5-10 years for surveillance                        based on pathology results. Procedure Code(s):    --- Professional ---                       [phone], Colonoscopy, flexible; with removal of tumor(s),                        polyp(s), or other lesion(s) by snare technique                       45380, 89, Colonoscopy, flexible; with biopsy,  single                        or multiple Diagnosis Code(s):    --- Professional ---                       Z12.11, Encounter for screening for malignant neoplasm                        of colon                       D12.2, Benign neoplasm of ascending colon                       K62.1, Rectal polyp                       K64.8, Other hemorrhoids CPT copyright 2018 American Medical Association. All rights reserved. The codes documented in this report are preliminary and upon coder review may  be revised to meet current compliance requirements. Dr. Ulyess Mort Lin Landsman MD, MD 07/16/2018 10:51:59 AM This report has been signed electronically. Number of Addenda: 0 Note Initiated On: 07/16/2018 10:17 AM Scope Withdrawal Time: 0 hours 18 minutes 17 seconds  Total Procedure Duration: 0 hours 24 minutes 21 seconds       [facility]

## 2018-07-16 NOTE — Anesthesia Postprocedure Evaluation (Signed)
Anesthesia Post Note  Patient: Steven Blankenship  Procedure(s) Performed: COLONOSCOPY WITH PROPOFOL (N/A )  Patient location during evaluation: Endoscopy Anesthesia Type: General Level of consciousness: awake and alert Pain management: pain level controlled Vital Signs Assessment: post-procedure vital signs reviewed and stable Respiratory status: spontaneous breathing and respiratory function stable Cardiovascular status: stable Anesthetic complications: no     Last Vitals:  Vitals:   07/16/18 1054 07/16/18 1104  BP: 129/76 (!) 138/95  Pulse: 87 84  Resp: 17 17  Temp: (!) 36.1 C   SpO2: 100% 100%    Last Pain:  Vitals:   07/16/18 1104  TempSrc:   PainSc: 0-No pain                 KEPHART,WILLIAM K

## 2018-07-16 NOTE — Anesthesia Procedure Notes (Signed)
Performed by: Ahkeem Goede, CRNA Pre-anesthesia Checklist: Patient identified, Emergency Drugs available, Suction available, Patient being monitored and Timeout performed Patient Re-evaluated:Patient Re-evaluated prior to induction Oxygen Delivery Method: Nasal cannula Induction Type: IV induction       

## 2018-07-16 NOTE — Anesthesia Preprocedure Evaluation (Signed)
Anesthesia Evaluation  Patient identified by MRN, date of birth, ID band Patient awake    Reviewed: Allergy & Precautions, NPO status , Patient's Chart, lab work & pertinent test results  History of Anesthesia Complications Negative for: history of anesthetic complications  Airway Mallampati: II       Dental   Pulmonary sleep apnea and Continuous Positive Airway Pressure Ventilation , COPD,  COPD inhaler,           Cardiovascular hypertension, Pt. on medications and Pt. on home beta blockers (-) Past MI and (-) CHF (-) dysrhythmias (-) Valvular Problems/Murmurs     Neuro/Psych neg Seizures    GI/Hepatic Neg liver ROS, neg GERD  ,  Endo/Other  neg diabetesMorbid obesity  Renal/GU negative Renal ROS     Musculoskeletal   Abdominal   Peds  Hematology   Anesthesia Other Findings   Reproductive/Obstetrics                             Anesthesia Physical Anesthesia Plan  ASA: III  Anesthesia Plan: General   Post-op Pain Management:    Induction: Intravenous  PONV Risk Score and Plan: 2  Airway Management Planned: Nasal Cannula  Additional Equipment:   Intra-op Plan:   Post-operative Plan:   Informed Consent: I have reviewed the patients History and Physical, chart, labs and discussed the procedure including the risks, benefits and alternatives for the proposed anesthesia with the patient or authorized representative who has indicated his/her understanding and acceptance.     Plan Discussed with:   Anesthesia Plan Comments:         Anesthesia Quick Evaluation

## 2018-07-17 ENCOUNTER — Encounter: Payer: Self-pay | Admitting: Gastroenterology

## 2018-07-18 ENCOUNTER — Encounter: Payer: Self-pay | Admitting: Gastroenterology

## 2018-07-18 LAB — SURGICAL PATHOLOGY

## 2018-10-11 ENCOUNTER — Encounter: Payer: Self-pay | Admitting: *Deleted

## 2018-11-12 ENCOUNTER — Encounter: Payer: BLUE CROSS/BLUE SHIELD | Admitting: Family Medicine

## 2018-11-29 ENCOUNTER — Other Ambulatory Visit: Payer: Self-pay | Admitting: Family Medicine

## 2018-12-03 ENCOUNTER — Emergency Department (HOSPITAL_COMMUNITY): Payer: BLUE CROSS/BLUE SHIELD

## 2018-12-03 ENCOUNTER — Inpatient Hospital Stay (HOSPITAL_COMMUNITY)
Admission: EM | Admit: 2018-12-03 | Discharge: 2018-12-07 | DRG: 089 | Disposition: A | Discharge status: Rehabilitation Facility | Payer: BLUE CROSS/BLUE SHIELD | Attending: General Surgery | Admitting: General Surgery

## 2018-12-03 ENCOUNTER — Encounter (HOSPITAL_COMMUNITY): Payer: Self-pay | Admitting: Emergency Medicine

## 2018-12-03 ENCOUNTER — Other Ambulatory Visit: Payer: Self-pay

## 2018-12-03 DIAGNOSIS — E46 Unspecified protein-calorie malnutrition: Secondary | ICD-10-CM | POA: Diagnosis not present

## 2018-12-03 DIAGNOSIS — M25561 Pain in right knee: Secondary | ICD-10-CM | POA: Diagnosis not present

## 2018-12-03 DIAGNOSIS — G4733 Obstructive sleep apnea (adult) (pediatric): Secondary | ICD-10-CM | POA: Diagnosis present

## 2018-12-03 DIAGNOSIS — Z6836 Body mass index (BMI) 36.0-36.9, adult: Secondary | ICD-10-CM

## 2018-12-03 DIAGNOSIS — R402412 Glasgow coma scale score 13-15, at arrival to emergency department: Secondary | ICD-10-CM | POA: Diagnosis present

## 2018-12-03 DIAGNOSIS — R7303 Prediabetes: Secondary | ICD-10-CM | POA: Diagnosis present

## 2018-12-03 DIAGNOSIS — S93402A Sprain of unspecified ligament of left ankle, initial encounter: Secondary | ICD-10-CM | POA: Diagnosis present

## 2018-12-03 DIAGNOSIS — E669 Obesity, unspecified: Secondary | ICD-10-CM | POA: Diagnosis present

## 2018-12-03 DIAGNOSIS — S2241XA Multiple fractures of ribs, right side, initial encounter for closed fracture: Secondary | ICD-10-CM | POA: Diagnosis present

## 2018-12-03 DIAGNOSIS — S2221XA Fracture of manubrium, initial encounter for closed fracture: Secondary | ICD-10-CM | POA: Diagnosis present

## 2018-12-03 DIAGNOSIS — S069X9S Unspecified intracranial injury with loss of consciousness of unspecified duration, sequela: Secondary | ICD-10-CM | POA: Diagnosis not present

## 2018-12-03 DIAGNOSIS — N289 Disorder of kidney and ureter, unspecified: Secondary | ICD-10-CM | POA: Diagnosis present

## 2018-12-03 DIAGNOSIS — R0902 Hypoxemia: Secondary | ICD-10-CM | POA: Diagnosis present

## 2018-12-03 DIAGNOSIS — S27322A Contusion of lung, bilateral, initial encounter: Secondary | ICD-10-CM | POA: Diagnosis present

## 2018-12-03 DIAGNOSIS — S060X9D Concussion with loss of consciousness of unspecified duration, subsequent encounter: Secondary | ICD-10-CM | POA: Diagnosis not present

## 2018-12-03 DIAGNOSIS — S060X9A Concussion with loss of consciousness of unspecified duration, initial encounter: Secondary | ICD-10-CM | POA: Diagnosis present

## 2018-12-03 DIAGNOSIS — I10 Essential (primary) hypertension: Secondary | ICD-10-CM | POA: Diagnosis present

## 2018-12-03 DIAGNOSIS — M542 Cervicalgia: Secondary | ICD-10-CM

## 2018-12-03 DIAGNOSIS — S069X9A Unspecified intracranial injury with loss of consciousness of unspecified duration, initial encounter: Secondary | ICD-10-CM | POA: Diagnosis not present

## 2018-12-03 DIAGNOSIS — D62 Acute posthemorrhagic anemia: Secondary | ICD-10-CM | POA: Diagnosis not present

## 2018-12-03 DIAGNOSIS — R740 Nonspecific elevation of levels of transaminase and lactic acid dehydrogenase [LDH]: Secondary | ICD-10-CM | POA: Diagnosis not present

## 2018-12-03 DIAGNOSIS — M25562 Pain in left knee: Secondary | ICD-10-CM | POA: Diagnosis not present

## 2018-12-03 DIAGNOSIS — T07XXXA Unspecified multiple injuries, initial encounter: Secondary | ICD-10-CM | POA: Diagnosis not present

## 2018-12-03 LAB — COMPREHENSIVE METABOLIC PANEL
ALBUMIN: 4 g/dL (ref 3.5–5.0)
ALT: 73 U/L — ABNORMAL HIGH (ref 0–44)
ANION GAP: 11 (ref 5–15)
AST: 106 U/L — ABNORMAL HIGH (ref 15–41)
Alkaline Phosphatase: 64 U/L (ref 38–126)
BUN: 12 mg/dL (ref 6–20)
CALCIUM: 9.3 mg/dL (ref 8.9–10.3)
CO2: 24 mmol/L (ref 22–32)
Chloride: 105 mmol/L (ref 98–111)
Creatinine, Ser: 1.42 mg/dL — ABNORMAL HIGH (ref 0.61–1.24)
GFR calc non Af Amer: 57 mL/min — ABNORMAL LOW (ref >60)
GLUCOSE: 133 mg/dL — AB (ref 70–99)
POTASSIUM: 4.3 mmol/L (ref 3.5–5.1)
SODIUM: 140 mmol/L (ref 135–145)
Total Bilirubin: 1.5 mg/dL — ABNORMAL HIGH (ref 0.3–1.2)
Total Protein: 7.1 g/dL (ref 6.5–8.1)

## 2018-12-03 LAB — CBC
HCT: 47.5 % (ref 39.0–52.0)
HEMOGLOBIN: 14.8 g/dL (ref 13.0–17.0)
MCH: 27 pg (ref 26.0–34.0)
MCHC: 31.2 g/dL (ref 30.0–36.0)
MCV: 86.5 fL (ref 80.0–100.0)
NRBC: 0 % (ref 0.0–0.2)
PLATELETS: 274 10*3/uL (ref 150–400)
RBC: 5.49 MIL/uL (ref 4.22–5.81)
RDW: 14.3 % (ref 11.5–15.5)
WBC: 13.1 10*3/uL — ABNORMAL HIGH (ref 4.0–10.5)

## 2018-12-03 LAB — URINALYSIS, ROUTINE W REFLEX MICROSCOPIC
Bacteria, UA: NONE SEEN
Bilirubin Urine: NEGATIVE
Glucose, UA: NEGATIVE mg/dL
Ketones, ur: NEGATIVE mg/dL
Leukocytes,Ua: NEGATIVE
Nitrite: NEGATIVE
Protein, ur: NEGATIVE mg/dL
Specific Gravity, Urine: 1.045 — ABNORMAL HIGH (ref 1.005–1.030)
pH: 5 (ref 5.0–8.0)

## 2018-12-03 LAB — SAMPLE TO BLOOD BANK

## 2018-12-03 LAB — CDS SEROLOGY

## 2018-12-03 LAB — LACTIC ACID, PLASMA: Lactic Acid, Venous: 6 mmol/L (ref 0.5–1.9)

## 2018-12-03 LAB — ETHANOL: Alcohol, Ethyl (B): <10 mg/dL (ref <10)

## 2018-12-03 LAB — PROTIME-INR
INR: 1 (ref 0.8–1.2)
PROTHROMBIN TIME: 13.1 s (ref 11.4–15.2)

## 2018-12-03 MED ORDER — DOCUSATE SODIUM 100 MG PO CAPS
100.0000 mg | ORAL_CAPSULE | Freq: Two times a day (BID) | ORAL | Status: DC
  Administered 2018-12-04 – 2018-12-07 (×7): 100 mg via ORAL
  Filled 2018-12-03 (×7): qty 1

## 2018-12-03 MED ORDER — HYDRALAZINE HCL 20 MG/ML IJ SOLN: 10.0000 mg | INTRAMUSCULAR | Status: DC | PRN

## 2018-12-03 MED ORDER — ONDANSETRON HCL 4 MG/2ML IJ SOLN
4.0000 mg | Freq: Once | INTRAMUSCULAR | Status: AC
  Administered 2018-12-03: 4 mg via INTRAVENOUS
  Filled 2018-12-03: qty 2

## 2018-12-03 MED ORDER — FENTANYL CITRATE (PF) 100 MCG/2ML IJ SOLN
100.0000 ug | Freq: Once | INTRAMUSCULAR | Status: AC
  Administered 2018-12-03: 100 ug via INTRAVENOUS
  Filled 2018-12-03: qty 2

## 2018-12-03 MED ORDER — ACETAMINOPHEN 325 MG PO TABS: 650.0000 mg | ORAL_TABLET | ORAL | Status: DC | PRN

## 2018-12-03 MED ORDER — AMLODIPINE BESYLATE 5 MG PO TABS
5.0000 mg | ORAL_TABLET | Freq: Every day | ORAL | Status: DC
  Administered 2018-12-04 – 2018-12-07 (×4): 5 mg via ORAL
  Filled 2018-12-03 (×4): qty 1

## 2018-12-03 MED ORDER — ALBUTEROL SULFATE (2.5 MG/3ML) 0.083% IN NEBU: 2.5000 mg | INHALATION_SOLUTION | Freq: Four times a day (QID) | RESPIRATORY_TRACT | Status: DC | PRN

## 2018-12-03 MED ORDER — MORPHINE SULFATE (PF) 4 MG/ML IV SOLN
6.0000 mg | Freq: Once | INTRAVENOUS | Status: AC
  Administered 2018-12-03: 6 mg via INTRAVENOUS
  Filled 2018-12-03: qty 2

## 2018-12-03 MED ORDER — IOHEXOL 300 MG/ML  SOLN
100.0000 mL | Freq: Once | INTRAMUSCULAR | Status: AC | PRN
  Administered 2018-12-03: 100 mL via INTRAVENOUS

## 2018-12-03 MED ORDER — SODIUM CHLORIDE 0.9 % IV SOLN
Freq: Once | INTRAVENOUS | Status: AC
  Administered 2018-12-03: 21:00:00 via INTRAVENOUS

## 2018-12-03 MED ORDER — OXYCODONE HCL 5 MG PO TABS
5.0000 mg | ORAL_TABLET | ORAL | Status: DC | PRN
  Administered 2018-12-03 – 2018-12-06 (×7): 10 mg via ORAL
  Filled 2018-12-03 (×7): qty 2

## 2018-12-03 MED ORDER — KCL IN DEXTROSE-NACL 20-5-0.45 MEQ/L-%-% IV SOLN
INTRAVENOUS | Status: DC
  Administered 2018-12-03 – 2018-12-06 (×5): via INTRAVENOUS
  Filled 2018-12-03 (×5): qty 1000

## 2018-12-03 MED ORDER — ONDANSETRON HCL 4 MG/2ML IJ SOLN: 4.0000 mg | Freq: Four times a day (QID) | INTRAMUSCULAR | Status: DC | PRN

## 2018-12-03 MED ORDER — ENOXAPARIN SODIUM 40 MG/0.4ML ~~LOC~~ SOLN
40.0000 mg | Freq: Every day | SUBCUTANEOUS | Status: DC
  Administered 2018-12-04 – 2018-12-07 (×4): 40 mg via SUBCUTANEOUS
  Filled 2018-12-03 (×4): qty 0.4

## 2018-12-03 MED ORDER — HYDROMORPHONE HCL 1 MG/ML IJ SOLN: 0.5000 mg | INTRAMUSCULAR | Status: DC | PRN

## 2018-12-03 MED ORDER — LACTATED RINGERS IV BOLUS
1000.0000 mL | Freq: Once | INTRAVENOUS | Status: AC
  Administered 2018-12-03: 1000 mL via INTRAVENOUS

## 2018-12-03 MED ORDER — METOPROLOL TARTRATE 25 MG PO TABS
25.0000 mg | ORAL_TABLET | Freq: Every day | ORAL | Status: DC
  Administered 2018-12-04 – 2018-12-07 (×4): 25 mg via ORAL
  Filled 2018-12-03 (×4): qty 1

## 2018-12-03 MED ORDER — TRAMADOL HCL 50 MG PO TABS
50.0000 mg | ORAL_TABLET | Freq: Four times a day (QID) | ORAL | Status: DC | PRN
  Administered 2018-12-05: 50 mg via ORAL
  Filled 2018-12-03: qty 1

## 2018-12-03 MED ORDER — FENTANYL CITRATE (PF) 100 MCG/2ML IJ SOLN
50.0000 ug | Freq: Once | INTRAMUSCULAR | Status: AC
  Administered 2018-12-03: 50 ug via INTRAVENOUS
  Filled 2018-12-03: qty 2

## 2018-12-03 MED ORDER — SODIUM CHLORIDE 0.9 % IV BOLUS
1000.0000 mL | Freq: Once | INTRAVENOUS | Status: AC
  Administered 2018-12-03: 1000 mL via INTRAVENOUS

## 2018-12-03 MED ORDER — LISINOPRIL 20 MG PO TABS
20.0000 mg | ORAL_TABLET | Freq: Every day | ORAL | Status: DC
  Administered 2018-12-04 – 2018-12-07 (×4): 20 mg via ORAL
  Filled 2018-12-03 (×4): qty 1

## 2018-12-03 MED ORDER — ONDANSETRON 4 MG PO TBDP: 4.0000 mg | ORAL_TABLET | Freq: Four times a day (QID) | ORAL | Status: DC | PRN

## 2018-12-03 MED ORDER — LACTATED RINGERS IV BOLUS: 1000.0000 mL | Freq: Once | INTRAVENOUS | Status: DC
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
## 2018-12-03 NOTE — ED Notes (Signed)
Wife updated

## 2018-12-03 NOTE — ED Notes (Signed)
Patient transported to CT 

## 2018-12-03 NOTE — ED Notes (Signed)
X-ray at bedside

## 2018-12-03 NOTE — ED Notes (Signed)
Steven Blankenship, wife [phone]
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
## 2018-12-03 NOTE — ED Notes (Signed)
Pt returned to room  
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
## 2018-12-03 NOTE — ED Notes (Signed)
Patient transported to X-ray 

## 2018-12-03 NOTE — ED Notes (Signed)
Pt returned from CT °

## 2018-12-04 ENCOUNTER — Inpatient Hospital Stay (HOSPITAL_COMMUNITY): Payer: BLUE CROSS/BLUE SHIELD

## 2018-12-04 LAB — BASIC METABOLIC PANEL
Anion gap: 9 (ref 5–15)
BUN: 10 mg/dL (ref 6–20)
CHLORIDE: 106 mmol/L (ref 98–111)
CO2: 27 mmol/L (ref 22–32)
Calcium: 9.1 mg/dL (ref 8.9–10.3)
Creatinine, Ser: 1.21 mg/dL (ref 0.61–1.24)
GFR calc Af Amer: >60 mL/min (ref >60)
GFR calc non Af Amer: >60 mL/min (ref >60)
GLUCOSE: 140 mg/dL — AB (ref 70–99)
POTASSIUM: 3.8 mmol/L (ref 3.5–5.1)
Sodium: 142 mmol/L (ref 135–145)

## 2018-12-04 LAB — CBC
HCT: 42.4 % (ref 39.0–52.0)
Hemoglobin: 13.4 g/dL (ref 13.0–17.0)
MCH: 27.2 pg (ref 26.0–34.0)
MCHC: 31.6 g/dL (ref 30.0–36.0)
MCV: 86.2 fL (ref 80.0–100.0)
Platelets: 233 10*3/uL (ref 150–400)
RBC: 4.92 MIL/uL (ref 4.22–5.81)
RDW: 14.7 % (ref 11.5–15.5)
WBC: 8.2 10*3/uL (ref 4.0–10.5)
nRBC: 0 % (ref 0.0–0.2)

## 2018-12-04 LAB — HIV ANTIBODY (ROUTINE TESTING W REFLEX): HIV Screen 4th Generation wRfx: NONREACTIVE

## 2018-12-04 MED ORDER — ACETAMINOPHEN 325 MG PO TABS
650.0000 mg | ORAL_TABLET | ORAL | Status: DC
  Administered 2018-12-04 – 2018-12-07 (×18): 650 mg via ORAL
  Filled 2018-12-04 (×18): qty 2

## 2018-12-04 MED ORDER — HYDROMORPHONE HCL 1 MG/ML IJ SOLN
0.5000 mg | INTRAMUSCULAR | Status: DC | PRN
  Administered 2018-12-04: 1 mg via INTRAVENOUS
  Filled 2018-12-04: qty 1

## 2018-12-04 MED ORDER — METHOCARBAMOL 500 MG PO TABS
500.0000 mg | ORAL_TABLET | Freq: Three times a day (TID) | ORAL | Status: DC
  Administered 2018-12-04 – 2018-12-07 (×11): 500 mg via ORAL
  Filled 2018-12-04 (×11): qty 1
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
## 2018-12-04 NOTE — Progress Notes (Signed)
Orthopedic Tech Progress Note Patient Details:  Steven Blankenship 1965-12-01 [id number]  Ortho Devices Type of Ortho Device: Ankle Air splint Ortho Device/Splint Interventions: Application   Post Interventions Patient Tolerated: Well   [name] 12/04/2018, 10:27 AM
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
## 2018-12-05 LAB — BASIC METABOLIC PANEL
Anion gap: 9 (ref 5–15)
BUN: 17 mg/dL (ref 6–20)
CHLORIDE: 106 mmol/L (ref 98–111)
CO2: 23 mmol/L (ref 22–32)
Calcium: 8.3 mg/dL — ABNORMAL LOW (ref 8.9–10.3)
Creatinine, Ser: 1.57 mg/dL — ABNORMAL HIGH (ref 0.61–1.24)
GFR calc Af Amer: 58 mL/min — ABNORMAL LOW (ref >60)
GFR calc non Af Amer: 50 mL/min — ABNORMAL LOW (ref >60)
Glucose, Bld: 123 mg/dL — ABNORMAL HIGH (ref 70–99)
Potassium: 4.1 mmol/L (ref 3.5–5.1)
Sodium: 138 mmol/L (ref 135–145)
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
## 2018-12-05 NOTE — Progress Notes (Signed)
Rehab Admissions Coordinator Note:  Per OT recommendation, this patient was screened by Jhonnie Garner for appropriateness for an Inpatient Acute Rehab Consult.  At this time, we are recommending Inpatient Rehab consult.AC will contact MD to request an IP rehab Consult Order.   Jhonnie Garner 12/05/2018, 1:11 PM  I can be reached at [phone].
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
## 2018-12-06 ENCOUNTER — Other Ambulatory Visit: Payer: Self-pay

## 2018-12-06 ENCOUNTER — Encounter (HOSPITAL_COMMUNITY): Payer: Self-pay | Admitting: *Deleted

## 2018-12-06 LAB — BASIC METABOLIC PANEL
Anion gap: 7 (ref 5–15)
Anion gap: 7 (ref 5–15)
BUN: 9 mg/dL (ref 6–20)
BUN: 9 mg/dL (ref 6–20)
CO2: 27 mmol/L (ref 22–32)
CO2: 28 mmol/L (ref 22–32)
Calcium: 8.3 mg/dL — ABNORMAL LOW (ref 8.9–10.3)
Calcium: 8.5 mg/dL — ABNORMAL LOW (ref 8.9–10.3)
Chloride: 104 mmol/L (ref 98–111)
Chloride: 104 mmol/L (ref 98–111)
Creatinine, Ser: 1.12 mg/dL (ref 0.61–1.24)
Creatinine, Ser: 1.05 mg/dL (ref 0.61–1.24)
GFR calc Af Amer: >60 mL/min (ref >60)
GFR calc Af Amer: >60 mL/min (ref >60)
GFR calc non Af Amer: >60 mL/min (ref >60)
GFR calc non Af Amer: >60 mL/min (ref >60)
Glucose, Bld: 143 mg/dL — ABNORMAL HIGH (ref 70–99)
Glucose, Bld: 123 mg/dL — ABNORMAL HIGH (ref 70–99)
Potassium: 3.4 mmol/L — ABNORMAL LOW (ref 3.5–5.1)
Potassium: 3.6 mmol/L (ref 3.5–5.1)
Sodium: 138 mmol/L (ref 135–145)
Sodium: 139 mmol/L (ref 135–145)

## 2018-12-06 MED ORDER — POTASSIUM CHLORIDE CRYS ER 20 MEQ PO TBCR
40.0000 meq | EXTENDED_RELEASE_TABLET | Freq: Once | ORAL | Status: AC
  Administered 2018-12-06: 40 meq via ORAL
  Filled 2018-12-06: qty 2
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
## 2018-12-06 NOTE — Progress Notes (Signed)
Spoke with patient's wife, Freada Bergeron, on the phone.  I updated her on patient's status and condition as well as expected plans moving forward.  She was grateful for the call.  All questions were answered.  She was encouraged to call if she had any specific questions as I told her the nurses could page [place] and let us know she wanted a call back.  She understood.  Henreitta Cea 9:46 AM 12/06/2018
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
## 2018-12-06 NOTE — Progress Notes (Signed)
Inpatient Rehab Admissions:  Inpatient Rehab Consult received.  I met with patient at the bedside for rehabilitation assessment and to discuss goals and expectations of an inpatient rehab admission.  Pt interested in rehab program and gave me permission to speak with his wife and open case with his insurance.  I will contact his wife today and await insurance authorization for potential rehab admission tomorrow.   Signed: Shann Medal, PT, DPT Admissions Coordinator [phone] 12/06/18  11:44 AM
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
## 2018-12-07 ENCOUNTER — Encounter (HOSPITAL_COMMUNITY): Payer: Self-pay

## 2018-12-07 ENCOUNTER — Other Ambulatory Visit: Payer: Self-pay

## 2018-12-07 ENCOUNTER — Inpatient Hospital Stay (HOSPITAL_COMMUNITY)
Admission: RE | Admit: 2018-12-07 | Discharge: 2018-12-11 | DRG: 945 | Disposition: A | Discharge status: Home / Self Care | Payer: BLUE CROSS/BLUE SHIELD | Source: Intra-hospital | Attending: Physical Medicine & Rehabilitation | Admitting: Physical Medicine & Rehabilitation

## 2018-12-07 DIAGNOSIS — S060X9D Concussion with loss of consciousness of unspecified duration, subsequent encounter: Secondary | ICD-10-CM | POA: Diagnosis present

## 2018-12-07 DIAGNOSIS — R740 Nonspecific elevation of levels of transaminase and lactic acid dehydrogenase [LDH]: Secondary | ICD-10-CM | POA: Diagnosis present

## 2018-12-07 DIAGNOSIS — E46 Unspecified protein-calorie malnutrition: Secondary | ICD-10-CM | POA: Diagnosis present

## 2018-12-07 DIAGNOSIS — Z6841 Body Mass Index (BMI) 40.0 and over, adult: Secondary | ICD-10-CM | POA: Diagnosis not present

## 2018-12-07 DIAGNOSIS — I10 Essential (primary) hypertension: Secondary | ICD-10-CM | POA: Diagnosis present

## 2018-12-07 DIAGNOSIS — M25561 Pain in right knee: Secondary | ICD-10-CM | POA: Diagnosis not present

## 2018-12-07 DIAGNOSIS — T07XXXA Unspecified multiple injuries, initial encounter: Secondary | ICD-10-CM

## 2018-12-07 DIAGNOSIS — S069X0S Unspecified intracranial injury without loss of consciousness, sequela: Secondary | ICD-10-CM

## 2018-12-07 DIAGNOSIS — E8809 Other disorders of plasma-protein metabolism, not elsewhere classified: Secondary | ICD-10-CM

## 2018-12-07 DIAGNOSIS — S069X9S Unspecified intracranial injury with loss of consciousness of unspecified duration, sequela: Secondary | ICD-10-CM | POA: Diagnosis not present

## 2018-12-07 DIAGNOSIS — S069XAA Unspecified intracranial injury with loss of consciousness status unknown, initial encounter: Secondary | ICD-10-CM | POA: Diagnosis present

## 2018-12-07 DIAGNOSIS — R7303 Prediabetes: Secondary | ICD-10-CM | POA: Diagnosis present

## 2018-12-07 DIAGNOSIS — Z713 Dietary counseling and surveillance: Secondary | ICD-10-CM

## 2018-12-07 DIAGNOSIS — G4733 Obstructive sleep apnea (adult) (pediatric): Secondary | ICD-10-CM | POA: Diagnosis present

## 2018-12-07 DIAGNOSIS — D62 Acute posthemorrhagic anemia: Secondary | ICD-10-CM | POA: Diagnosis present

## 2018-12-07 DIAGNOSIS — M25562 Pain in left knee: Secondary | ICD-10-CM

## 2018-12-07 DIAGNOSIS — S069X9A Unspecified intracranial injury with loss of consciousness of unspecified duration, initial encounter: Secondary | ICD-10-CM | POA: Diagnosis not present

## 2018-12-07 DIAGNOSIS — R7401 Elevation of levels of liver transaminase levels: Secondary | ICD-10-CM

## 2018-12-07 MED ORDER — PROCHLORPERAZINE MALEATE 5 MG PO TABS: 5.0000 mg | ORAL_TABLET | Freq: Four times a day (QID) | ORAL | Status: DC | PRN

## 2018-12-07 MED ORDER — POLYETHYLENE GLYCOL 3350 17 G PO PACK: 17.0000 g | PACK | Freq: Every day | ORAL | Status: DC | PRN

## 2018-12-07 MED ORDER — ENOXAPARIN SODIUM 40 MG/0.4ML ~~LOC~~ SOLN
40.0000 mg | SUBCUTANEOUS | Status: DC
  Administered 2018-12-08: 40 mg via SUBCUTANEOUS
  Filled 2018-12-07: qty 0.4

## 2018-12-07 MED ORDER — BISACODYL 10 MG RE SUPP: 10.0000 mg | Freq: Every day | RECTAL | Status: DC | PRN

## 2018-12-07 MED ORDER — AMLODIPINE BESYLATE 5 MG PO TABS
5.0000 mg | ORAL_TABLET | Freq: Every day | ORAL | Status: DC
  Administered 2018-12-08 – 2018-12-11 (×4): 5 mg via ORAL
  Filled 2018-12-07 (×4): qty 1

## 2018-12-07 MED ORDER — FLEET ENEMA 7-19 GM/118ML RE ENEM: 1.0000 | ENEMA | Freq: Once | RECTAL | Status: DC | PRN

## 2018-12-07 MED ORDER — OXYCODONE HCL 5 MG PO TABS: 5.0000 mg | ORAL_TABLET | ORAL | Status: DC | PRN

## 2018-12-07 MED ORDER — LISINOPRIL 20 MG PO TABS
20.0000 mg | ORAL_TABLET | Freq: Every day | ORAL | Status: DC
  Administered 2018-12-08 – 2018-12-11 (×4): 20 mg via ORAL
  Filled 2018-12-07 (×4): qty 1

## 2018-12-07 MED ORDER — GUAIFENESIN-DM 100-10 MG/5ML PO SYRP: 5.0000 mL | ORAL_SOLUTION | Freq: Four times a day (QID) | ORAL | Status: DC | PRN

## 2018-12-07 MED ORDER — ALBUTEROL SULFATE (2.5 MG/3ML) 0.083% IN NEBU: 2.5000 mg | INHALATION_SOLUTION | Freq: Four times a day (QID) | RESPIRATORY_TRACT | Status: DC | PRN

## 2018-12-07 MED ORDER — PROCHLORPERAZINE 25 MG RE SUPP: 12.5000 mg | Freq: Four times a day (QID) | RECTAL | Status: DC | PRN

## 2018-12-07 MED ORDER — TRAMADOL HCL 50 MG PO TABS
50.0000 mg | ORAL_TABLET | Freq: Four times a day (QID) | ORAL | Status: DC | PRN
  Administered 2018-12-08 – 2018-12-10 (×2): 50 mg via ORAL
  Filled 2018-12-07 (×2): qty 1

## 2018-12-07 MED ORDER — METOPROLOL TARTRATE 25 MG PO TABS
25.0000 mg | ORAL_TABLET | Freq: Every day | ORAL | Status: DC
  Administered 2018-12-08 – 2018-12-11 (×4): 25 mg via ORAL
  Filled 2018-12-07 (×4): qty 1

## 2018-12-07 MED ORDER — METHOCARBAMOL 500 MG PO TABS
500.0000 mg | ORAL_TABLET | Freq: Four times a day (QID) | ORAL | Status: DC
  Administered 2018-12-07 – 2018-12-11 (×14): 500 mg via ORAL
  Filled 2018-12-07 (×14): qty 1

## 2018-12-07 MED ORDER — INSULIN ASPART 100 UNIT/ML ~~LOC~~ SOLN
0.0000 [IU] | Freq: Three times a day (TID) | SUBCUTANEOUS | Status: DC
  Administered 2018-12-09 – 2018-12-10 (×2): 1 [IU] via SUBCUTANEOUS

## 2018-12-07 MED ORDER — DIPHENHYDRAMINE HCL 12.5 MG/5ML PO ELIX: 12.5000 mg | ORAL_SOLUTION | Freq: Four times a day (QID) | ORAL | Status: DC | PRN

## 2018-12-07 MED ORDER — ACETAMINOPHEN 325 MG PO TABS: 325.0000 mg | ORAL_TABLET | ORAL | Status: DC | PRN

## 2018-12-07 MED ORDER — ALUM & MAG HYDROXIDE-SIMETH 200-200-20 MG/5ML PO SUSP: 30.0000 mL | ORAL | Status: DC | PRN

## 2018-12-07 MED ORDER — PROCHLORPERAZINE EDISYLATE 10 MG/2ML IJ SOLN: 5.0000 mg | Freq: Four times a day (QID) | INTRAMUSCULAR | Status: DC | PRN

## 2018-12-07 MED ORDER — TRAZODONE HCL 50 MG PO TABS
25.0000 mg | ORAL_TABLET | Freq: Every evening | ORAL | Status: DC | PRN
  Administered 2018-12-08 – 2018-12-09 (×3): 50 mg via ORAL
  Filled 2018-12-07 (×3): qty 1

## 2018-12-07 MED ORDER — ACETAMINOPHEN 500 MG PO TABS
1000.0000 mg | ORAL_TABLET | Freq: Three times a day (TID) | ORAL | Status: DC
  Administered 2018-12-08 – 2018-12-11 (×11): 1000 mg via ORAL
  Filled 2018-12-07 (×12): qty 2

## 2018-12-07 MED ORDER — INSULIN ASPART 100 UNIT/ML ~~LOC~~ SOLN: 0.0000 [IU] | Freq: Every day | SUBCUTANEOUS | Status: DC
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
## 2018-12-07 NOTE — Progress Notes (Signed)
Pt arrived to unit via bed at approx 1800. Pt alert and oriented x 4, able to make needs known, no c/o pain. Bed in low position, call bell in reach, bed alarm on.Marland Kitchen
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
## 2018-12-07 NOTE — Progress Notes (Signed)
Inpatient Rehab Admissions Coordinator:   I met with patient at bedside and spoke with wife over the phone.  I have insurance authorization and will notify Trauma PA, Claiborne Billings, of bed availability.  I have alerted CM and CSW.    Shann Medal, PT, DPT Admissions Coordinator [phone] 12/07/18  3:42 PM
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
## 2018-12-08 ENCOUNTER — Inpatient Hospital Stay (HOSPITAL_COMMUNITY): Payer: BLUE CROSS/BLUE SHIELD | Admitting: Occupational Therapy

## 2018-12-08 ENCOUNTER — Inpatient Hospital Stay (HOSPITAL_COMMUNITY): Payer: BLUE CROSS/BLUE SHIELD

## 2018-12-08 ENCOUNTER — Inpatient Hospital Stay (HOSPITAL_COMMUNITY): Payer: BLUE CROSS/BLUE SHIELD | Admitting: Physical Therapy

## 2018-12-08 DIAGNOSIS — E46 Unspecified protein-calorie malnutrition: Secondary | ICD-10-CM

## 2018-12-08 DIAGNOSIS — R740 Nonspecific elevation of levels of transaminase and lactic acid dehydrogenase [LDH]: Secondary | ICD-10-CM

## 2018-12-08 DIAGNOSIS — S069X9A Unspecified intracranial injury with loss of consciousness of unspecified duration, initial encounter: Secondary | ICD-10-CM

## 2018-12-08 DIAGNOSIS — D62 Acute posthemorrhagic anemia: Secondary | ICD-10-CM

## 2018-12-08 DIAGNOSIS — R7303 Prediabetes: Secondary | ICD-10-CM

## 2018-12-08 DIAGNOSIS — E8809 Other disorders of plasma-protein metabolism, not elsewhere classified: Secondary | ICD-10-CM

## 2018-12-08 DIAGNOSIS — I10 Essential (primary) hypertension: Secondary | ICD-10-CM

## 2018-12-08 DIAGNOSIS — R7401 Elevation of levels of liver transaminase levels: Secondary | ICD-10-CM

## 2018-12-08 LAB — CBC WITH DIFFERENTIAL/PLATELET
Abs Immature Granulocytes: 0.08 10*3/uL — ABNORMAL HIGH (ref 0.00–0.07)
Basophils Absolute: 0 10*3/uL (ref 0.0–0.1)
Basophils Relative: 1 %
Eosinophils Absolute: 0.1 10*3/uL (ref 0.0–0.5)
Eosinophils Relative: 1 %
HCT: 40 % (ref 39.0–52.0)
Hemoglobin: 12.9 g/dL — ABNORMAL LOW (ref 13.0–17.0)
Immature Granulocytes: 1 %
Lymphocytes Relative: 30 %
Lymphs Abs: 1.8 10*3/uL (ref 0.7–4.0)
MCH: 27 pg (ref 26.0–34.0)
MCHC: 32.3 g/dL (ref 30.0–36.0)
MCV: 83.7 fL (ref 80.0–100.0)
Monocytes Absolute: 0.8 10*3/uL (ref 0.1–1.0)
Monocytes Relative: 14 %
Neutro Abs: 3.1 10*3/uL (ref 1.7–7.7)
Neutrophils Relative %: 53 %
Platelets: 238 10*3/uL (ref 150–400)
RBC: 4.78 MIL/uL (ref 4.22–5.81)
RDW: 13.9 % (ref 11.5–15.5)
WBC: 5.9 10*3/uL (ref 4.0–10.5)
nRBC: 0 % (ref 0.0–0.2)

## 2018-12-08 LAB — COMPREHENSIVE METABOLIC PANEL
ALT: 72 U/L — ABNORMAL HIGH (ref 0–44)
AST: 57 U/L — ABNORMAL HIGH (ref 15–41)
Albumin: 3.1 g/dL — ABNORMAL LOW (ref 3.5–5.0)
Alkaline Phosphatase: 79 U/L (ref 38–126)
Anion gap: 10 (ref 5–15)
BUN: 13 mg/dL (ref 6–20)
CO2: 25 mmol/L (ref 22–32)
Calcium: 8.9 mg/dL (ref 8.9–10.3)
Chloride: 103 mmol/L (ref 98–111)
Creatinine, Ser: 1.2 mg/dL (ref 0.61–1.24)
GFR calc Af Amer: >60 mL/min (ref >60)
GFR calc non Af Amer: >60 mL/min (ref >60)
Glucose, Bld: 125 mg/dL — ABNORMAL HIGH (ref 70–99)
Potassium: 4 mmol/L (ref 3.5–5.1)
Sodium: 138 mmol/L (ref 135–145)
Total Bilirubin: 1 mg/dL (ref 0.3–1.2)
Total Protein: 6.2 g/dL — ABNORMAL LOW (ref 6.5–8.1)

## 2018-12-08 LAB — GLUCOSE, CAPILLARY
Glucose-Capillary: 102 mg/dL — ABNORMAL HIGH (ref 70–99)
Glucose-Capillary: 113 mg/dL — ABNORMAL HIGH (ref 70–99)
Glucose-Capillary: 118 mg/dL — ABNORMAL HIGH (ref 70–99)
Glucose-Capillary: 102 mg/dL — ABNORMAL HIGH (ref 70–99)
Glucose-Capillary: 105 mg/dL — ABNORMAL HIGH (ref 70–99)
Glucose-Capillary: 119 mg/dL — ABNORMAL HIGH (ref 70–99)

## 2018-12-08 MED ORDER — PRO-STAT SUGAR FREE PO LIQD
30.0000 mL | Freq: Two times a day (BID) | ORAL | Status: DC
  Administered 2018-12-08 – 2018-12-11 (×7): 30 mL via ORAL
  Filled 2018-12-08 (×7): qty 30

## 2018-12-08 MED ORDER — ENOXAPARIN SODIUM 80 MG/0.8ML ~~LOC~~ SOLN
80.0000 mg | SUBCUTANEOUS | Status: DC
  Administered 2018-12-09 – 2018-12-10 (×2): 80 mg via SUBCUTANEOUS
  Filled 2018-12-08 (×3): qty 0.8
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
## 2018-12-08 NOTE — Plan of Care (Signed)
  Problem: Consults Goal: RH GENERAL PATIENT EDUCATION Description See Patient Education module for education specifics. Outcome: Progressing Goal: Skin Care Protocol Initiated - if Braden Score 18 or less Description If consults are not indicated, leave blank or document N/A Outcome: Progressing Goal: Nutrition Consult-if indicated Outcome: Progressing Goal: Diabetes Guidelines if Diabetic/Glucose > 140 Description If diabetic or lab glucose is > 140 mg/dl - Initiate Diabetes/Hyperglycemia Guidelines & Document Interventions  Outcome: Progressing   Problem: RH SKIN INTEGRITY Goal: RH STG SKIN FREE OF INFECTION/BREAKDOWN Description Will be free of skin breakdown or infection while in Rehab with min assist.  Outcome: Progressing Goal: RH STG MAINTAIN SKIN INTEGRITY WITH ASSISTANCE Description STG Maintain Skin Integrity With min assistance.  Outcome: Progressing   Problem: RH SAFETY Goal: RH STG ADHERE TO SAFETY PRECAUTIONS W/ASSISTANCE/DEVICE Description STG Adhere to Safety Precautions With min assistance/Device.  Outcome: Progressing Goal: RH STG DECREASED RISK OF FALL WITH ASSISTANCE Description STG Decreased Risk of Fall With Assistance. Outcome: Progressing   Problem: RH PAIN MANAGEMENT Goal: RH STG PAIN MANAGED AT OR BELOW PT'S PAIN GOAL Description <3   Outcome: Progressing   Problem: RH KNOWLEDGE DEFICIT GENERAL Goal: RH STG INCREASE KNOWLEDGE OF SELF CARE AFTER HOSPITALIZATION Description Increase in self care knowledge after hospitalization with min assist.  Outcome: Progressing

## 2018-12-09 DIAGNOSIS — T07XXXA Unspecified multiple injuries, initial encounter: Secondary | ICD-10-CM

## 2018-12-09 DIAGNOSIS — M25562 Pain in left knee: Secondary | ICD-10-CM

## 2018-12-09 DIAGNOSIS — M25561 Pain in right knee: Secondary | ICD-10-CM

## 2018-12-09 NOTE — Plan of Care (Signed)
  Problem: Consults Goal: RH GENERAL PATIENT EDUCATION Description See Patient Education module for education specifics. Outcome: Progressing Goal: Skin Care Protocol Initiated - if Braden Score 18 or less Description If consults are not indicated, leave blank or document N/A Outcome: Progressing Goal: Nutrition Consult-if indicated Outcome: Progressing Goal: Diabetes Guidelines if Diabetic/Glucose > 140 Description If diabetic or lab glucose is > 140 mg/dl - Initiate Diabetes/Hyperglycemia Guidelines & Document Interventions  Outcome: Progressing   Problem: RH SKIN INTEGRITY Goal: RH STG SKIN FREE OF INFECTION/BREAKDOWN Description Will be free of skin breakdown or infection while in Rehab with min assist.  Outcome: Progressing Goal: RH STG MAINTAIN SKIN INTEGRITY WITH ASSISTANCE Description STG Maintain Skin Integrity With min assistance.  Outcome: Progressing   Problem: RH SAFETY Goal: RH STG ADHERE TO SAFETY PRECAUTIONS W/ASSISTANCE/DEVICE Description STG Adhere to Safety Precautions With min assistance/Device.  Outcome: Progressing Goal: RH STG DECREASED RISK OF FALL WITH ASSISTANCE Description STG Decreased Risk of Fall With Assistance. Outcome: Progressing   Problem: RH PAIN MANAGEMENT Goal: RH STG PAIN MANAGED AT OR BELOW PT'S PAIN GOAL Description <3   Outcome: Progressing   Problem: RH KNOWLEDGE DEFICIT GENERAL Goal: RH STG INCREASE KNOWLEDGE OF SELF CARE AFTER HOSPITALIZATION Description Increase in self care knowledge after hospitalization with min assist.  Outcome: Progressing
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
## 2018-12-10 ENCOUNTER — Inpatient Hospital Stay (HOSPITAL_COMMUNITY): Payer: BLUE CROSS/BLUE SHIELD | Admitting: Occupational Therapy

## 2018-12-10 ENCOUNTER — Inpatient Hospital Stay (HOSPITAL_COMMUNITY): Payer: BLUE CROSS/BLUE SHIELD

## 2018-12-10 ENCOUNTER — Inpatient Hospital Stay (HOSPITAL_COMMUNITY): Payer: BLUE CROSS/BLUE SHIELD | Admitting: Speech Pathology

## 2018-12-10 LAB — GLUCOSE, CAPILLARY
Glucose-Capillary: 113 mg/dL — ABNORMAL HIGH (ref 70–99)
Glucose-Capillary: 117 mg/dL — ABNORMAL HIGH (ref 70–99)
Glucose-Capillary: 121 mg/dL — ABNORMAL HIGH (ref 70–99)
Glucose-Capillary: 104 mg/dL — ABNORMAL HIGH (ref 70–99)
Glucose-Capillary: 117 mg/dL — ABNORMAL HIGH (ref 70–99)
Glucose-Capillary: 127 mg/dL — ABNORMAL HIGH (ref 70–99)

## 2018-12-10 NOTE — Progress Notes (Signed)
Inpatient Rehabilitation  Patient information reviewed and entered into eRehab system by Shantika Bermea M. Ricketta Colantonio, M.A., CCC/SLP, PPS Coordinator.  Information including medical coding, functional ability and quality indicators will be reviewed and updated through discharge.    

## 2018-12-10 NOTE — Progress Notes (Signed)
Patient is able to place himself on CPAP when he is ready.

## 2018-12-10 NOTE — Care Management Note (Signed)
[facility] Individual Statement of Services  Patient Name:  Steven Blankenship  Date:  12/10/2018  Welcome to the [facility].  Our goal is to provide you with an individualized program based on your diagnosis and situation, designed to meet your specific needs.  With this comprehensive rehabilitation program, you will be expected to participate in at least 3 hours of rehabilitation therapies Monday-Friday, with modified therapy programming on the weekends.  Your rehabilitation program will include the following services:  Physical Therapy (PT), Occupational Therapy (OT), Speech Therapy (ST), 24 hour per day rehabilitation nursing, Case Management (Social Worker), Rehabilitation Medicine, Nutrition Services and Pharmacy Services  Weekly team conferences will be held on Wednesday to discuss your progress.  Your Social Worker will talk with you frequently to get your input and to update you on team discussions.  Team conferences with you and your family in attendance may also be held.  Expected length of stay: 5 days  Overall anticipated outcome: independent with device  Depending on your progress and recovery, your program may change. Your Social Worker will coordinate services and will keep you informed of any changes. Your Social Worker's name and contact numbers are listed  below.  The following services may also be recommended but are not provided by the [facility] will be made to provide these services after discharge if needed.  Arrangements include referral to agencies that provide these services.  Your insurance has been verified to be:  [place] Your primary doctor is:  Steven Blankenship  Pertinent information will be shared with your doctor and your insurance company.  Social Worker:  Steven Blankenship, [place] or ([phone]  Information discussed with and copy given to patient by: Elease Hashimoto, 12/10/2018, 10:37 AM

## 2018-12-10 NOTE — Progress Notes (Signed)
Occupational Therapy Session Note  Patient Details  Name: Steven Blankenship MRN: [id number] Date of Birth: 1966/04/08  Today's Date: 12/10/2018 OT Individual Time: 1829-9371 OT Individual Time Calculation (min): 70 min    Short Term Goals: Week 1:  OT Short Term Goal 1 (Week 1): STG=LTG 2/2 ELOS  Skilled Therapeutic Interventions/Progress Updates:    Treatment session with focus on functional mobility, dynamic standing, and ADL retraining.  Pt received supine in bed declining shower this session.  Pt completed bed mobility with supervision and donned hospital socks and shoes without assistance after therapist donned air cast to Lt ankle.  Pt decided to not wear shoes, therefore doffed.  Pt ambulated to toilet without AD with supervision.  Pt completed toileting at Mod I level.  Grooming tasks completed in standing at sink.  Ambulated to therapy gym with supervision without AD.  Engaged in dynamic standing activities to challenge balance reactions and endurance.  Engaged in alternating toe taps on step and to numbered discs in sequence, reaching to retrieve items from floor, and obstacle course to include stepping over and around items.  Engaged in rebounder task to challenge balance reactions and weight shifting.  Pt returned to room while dribbling basketball for additional balance challenge.  Pt asking questions about finger sticks and insulin, therapist encouraged pt to speak with RN.  Notified RN of pt questions, she is aware and has already educated him - will continue to educate due to decreased memory/recall.  Therapy Documentation Precautions:  Precautions Precautions: Fall Required Braces or Orthoses: Splint/Cast Splint/Cast: L ankle air cast Restrictions Weight Bearing Restrictions: No LLE Weight Bearing: Weight bearing as tolerated General:   Vital Signs: Therapy Vitals Temp: 98.8 F (37.1 C) Temp Source: Oral Pulse Rate: 78 Resp: 18 BP: 129/78 Patient Position (if  appropriate): Lying Oxygen Therapy SpO2: 98 % O2 Device: Room Air Pain: Pain Assessment Pain Scale: 0-10 Pain Score: 0-No pain   Therapy/Group: Individual Therapy  Simonne Come 12/10/2018, 9:08 AM

## 2018-12-10 NOTE — Progress Notes (Signed)
Physical Therapy Session Note  Patient Details  Name: Steven Blankenship MRN: [id number] Date of Birth: 1966/03/09  Today's Date: 12/10/2018 PT Individual Time:  -      Short Term Goals: Week 1:  PT Short Term Goal 1 (Week 1): =LTGs due to ELOS  Skilled Therapeutic Interventions/Progress Updates:   Pt resting in bed.  Bed moibility from flat bed to sit up.  PT educated pt on fit and purpose of L ankle air cast; it was positioned too far posterior on the foot.  PT re-donned it.  Pt donned bil shoes in sitting.  Pt noted to have leg length discrepancy due to previous L femur fx.  LLE shorter than R; pt reports 3 cm.  He has tried a shoe lift in the past but finally settled on an extra inner sole into L shoe.  PT recommended New Balance shoes and revisiting shoe build up to prevent spinal problems   Simulated car transfer truck height, supervision.  Floor transfer with supervision.  Discussed fall recovery and use of 911.    Given external perturbations, pt demonstrated bil ankle strategy and delayed hip strategy.       Therapy Documentation Precautions:  Precautions Precautions: Fall Required Braces or Orthoses: Splint/Cast Splint/Cast: L ankle air cast Restrictions Weight Bearing Restrictions: No LLE Weight Bearing: Weight bearing as tolerated General:   Vital Signs: Therapy Vitals Temp: 98.8 F (37.1 C) Temp Source: Oral Pulse Rate: 78 Resp: 18 BP: 129/78 Patient Position (if appropriate): Lying Oxygen Therapy SpO2: 98 % O2 Device: Room Air Pain:   Mobility:   Locomotion :    Trunk/Postural Assessment :    Balance:   Exercises:   Other Treatments:      Therapy/Group: Individual Therapy  Breindy Meadow 12/10/2018, 7:59 AM
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
## 2018-12-10 NOTE — Progress Notes (Signed)
Speech Language Pathology Discharge Summary  Patient Details  Name: Steven Blankenship MRN: [id number] Date of Birth: 06-Sep-1965  Today's Date: 12/10/2018 SLP Individual Time: 1194-1740 SLP Individual Time Calculation (min): 45 min   Skilled Therapeutic Interventions:  Skilled treatment session focused on completing education with pt on concussions, compensatory memory strategies and anticipatory awareness for discharge on 4/7. All questions answered to pt satisfaction. Pt will have appropriate help at home.      Patient has met 4 of 4 long term goals.  Patient to discharge at overall Supervision;Modified Independent level.    Clinical Impression/Discharge Summary:   Pt is requesting to discharge and all education has been completed. No further skilled ST is indicated for follow up.   Care Partner:  Caregiver Able to Provide Assistance: Yes  Type of Caregiver Assistance: Cognitive  Recommendation:  None       Reasons for discharge: Treatment goals met;Discharged from hospital   Patient/Family Agrees with Progress Made and Goals Achieved: Yes    Sera Hitsman 12/10/2018, 12:38 PM

## 2018-12-10 NOTE — Progress Notes (Signed)
Social Work Patient ID: Steven Blankenship, male   DOB: 1966-08-16, 53 y.o.   MRN: [id number]  Met with pt who wants to go home tomorrow, MD reports medically stable for discharge, team is in agreement with this plan. Sarah-OT wants to see in am prior to DC home.

## 2018-12-10 NOTE — Plan of Care (Signed)
Inpatient Rehabilitation  Due to the current state of emergency from COVID-19, patients may not be receiving their 3-hours of Medicare-mandated therapy.    
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
## 2018-12-11 ENCOUNTER — Inpatient Hospital Stay (HOSPITAL_COMMUNITY): Payer: BLUE CROSS/BLUE SHIELD | Admitting: Occupational Therapy

## 2018-12-11 ENCOUNTER — Encounter (HOSPITAL_COMMUNITY): Payer: BLUE CROSS/BLUE SHIELD | Admitting: Psychology

## 2018-12-11 ENCOUNTER — Inpatient Hospital Stay (HOSPITAL_COMMUNITY): Payer: BLUE CROSS/BLUE SHIELD

## 2018-12-11 ENCOUNTER — Inpatient Hospital Stay (HOSPITAL_COMMUNITY): Payer: BLUE CROSS/BLUE SHIELD | Admitting: Speech Pathology

## 2018-12-11 LAB — GLUCOSE, CAPILLARY
Glucose-Capillary: 91 mg/dL (ref 70–99)
Glucose-Capillary: 129 mg/dL — ABNORMAL HIGH (ref 70–99)
Glucose-Capillary: 113 mg/dL — ABNORMAL HIGH (ref 70–99)
Glucose-Capillary: 88 mg/dL (ref 70–99)

## 2018-12-11 MED ORDER — TRAMADOL HCL 50 MG PO TABS: 50.0000 mg | ORAL_TABLET | Freq: Four times a day (QID) | ORAL | 0 refills | Status: DC | PRN

## 2018-12-11 MED ORDER — ACETAMINOPHEN 500 MG PO TABS: 1000.0000 mg | ORAL_TABLET | Freq: Three times a day (TID) | ORAL | 0 refills | Status: DC

## 2018-12-11 MED ORDER — METHOCARBAMOL 500 MG PO TABS: 500.0000 mg | ORAL_TABLET | Freq: Four times a day (QID) | ORAL | 1 refills | Status: DC

## 2018-12-11 NOTE — Discharge Instructions (Signed)
Inpatient Rehab Discharge Instructions  [name] Discharge date and time: 12/11/18   Activities/Precautions/ Functional Status: Activity: no lifting, driving, or strenuous exercise till cleared by MD Diet: low fat, low cholesterol diet Wound Care: keep wound clean and dry   Functional status:  ___ No restrictions     ___ Walk up steps independently ___ 24/7 supervision/assistance   ___ Walk up steps with assistance _X__ Intermittent supervision/assistance  ___ Bathe/dress independently ___ Walk with walker     ___ Bathe/dress with assistance ___ Walk Independently    ___ Shower independently ___ Walk with assistance    ___ Shower with assistance _X__ No alcohol     ___ Return to work/school ________   Special Instructions:  COMMUNITY REFERRALS UPON DISCHARGE:   None:  HOME EXERCISE PROGRAM GIVEN TO PATIENT  Medical Equipment/Items Ordered:NO NEEDS     GENERAL COMMUNITY RESOURCES FOR PATIENT/FAMILY: Support Groups:[employer] SUPPORT GROUP THE SECOND Tuesday OF EACH MONTH @ 7:00-8:30 PM ON THE REHAB UNIT QUESTIONS CONTACT LUCY [phone]   My questions have been answered and I understand these instructions. I will adhere to these goals and the provided educational materials after my discharge from the hospital.  Patient/Caregiver Signature _______________________________ Date __________  Clinician Signature _______________________________________ Date __________  Please bring this form and your medication list with you to all your follow-up doctor's appointments.

## 2018-12-11 NOTE — Progress Notes (Signed)
Occupational Therapy Discharge Summary  Patient Details  Name: Steven Blankenship MRN: [id number] Date of Birth: 1965/12/01  Patient has met 9 of 9 long term goals due to improved activity tolerance, improved balance, ability to compensate for deficits and improved awareness.  Patient to discharge at overall Modified Independent level.  Patient's care partner is independent to provide the necessary cognitive assistance at discharge.  Pt continues to demonstrate impaired memory/recall of new information and wife and children will be home to provide supervision from a memory and awareness standpoint.  Reasons goals not met: N/A  Recommendation:  Patient will not require follow up OT at this time.    Equipment: No equipment provided  Reasons for discharge: treatment goals met and discharge from hospital  Patient/family agrees with progress made and goals achieved: Yes  OT Discharge Precautions/Restrictions  Precautions Precautions: Fall Required Braces or Orthoses: Splint/Cast Splint/Cast: L ankle air cast Restrictions Weight Bearing Restrictions: No LLE Weight Bearing: Weight bearing as tolerated Pain Pain Assessment Pain Scale: 0-10 Pain Score: 0-No pain ADL ADL Eating: Independent Grooming: Independent Where Assessed-Grooming: Standing at sink Upper Body Bathing: Modified independent Where Assessed-Upper Body Bathing: Shower Lower Body Bathing: Modified independent Where Assessed-Lower Body Bathing: Shower Upper Body Dressing: Independent Where Assessed-Upper Body Dressing: Chair Lower Body Dressing: Modified independent Where Assessed-Lower Body Dressing: Chair Toileting: Independent Toilet Transfer: Modified independent Armed forces technical officer Method: Counselling psychologist: Ambulance person Transfer: Minimal assistance Social research officer, government: Modified independent Social research officer, government Method: Heritage manager: Radio broadcast assistant,  Grab bars Vision Baseline Vision/History: Wears glasses Wears Glasses: Reading only Patient Visual Report: No change from baseline Vision Assessment?: No apparent visual deficits Perception  Perception: Within Functional Limits Praxis Praxis: Intact Cognition Overall Cognitive Status: Impaired/Different from baseline Arousal/Alertness: Awake/alert Orientation Level: Oriented X4 Attention: Alternating Alternating Attention: Appears intact Memory: Impaired Memory Impairment: Decreased recall of new information Awareness: Impaired Problem Solving: Impaired Safety/Judgment: Appears intact Sensation Sensation Light Touch: Appears Intact Proprioception: Appears Intact Coordination Gross Motor Movements are Fluid and Coordinated: Yes Fine Motor Movements are Fluid and Coordinated: Yes Extremity/Trunk Assessment RUE Assessment RUE Assessment: Within Functional Limits LUE Assessment LUE Assessment: Exceptions to [facility] General Strength Comments: L shoulder bruised and some pain with FF, but WFL AROM, did not test strength formally 2/2 pain LUE Body System: Hulen Shouts, [place] 12/11/2018, 11:09 AM

## 2018-12-11 NOTE — Progress Notes (Signed)
Social Work  Discharge Note  The overall goal for the admission was met for:   Discharge location: [place]  Length of Stay: Yes-4 DAYS  Discharge activity level: Yes-INDEPENDENT LEVEL  Home/community participation: Yes  Services provided included: MD, RD, PT, OT, SLP, RN, CM, Pharmacy and SW  Financial Services: Private Insurance: [employer]  Follow-up services arranged: Other: HOME EXERCISE PROGRAM  Comments (or additional information):PT MADE QUICK PROGRESS AND HAS NO EQUIPMENT NEEDS AND/OR THERAPY NEEDS. WIFE AND CHILDREN HOME DUE TO COVID-19  Patient/Family verbalized understanding of follow-up arrangements: Yes  Individual responsible for coordination of the follow-up plan: SELF & ANDREA-WIFE  Confirmed correct DME delivered: Elease Hashimoto 12/11/2018    Elease Hashimoto
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
## 2018-12-11 NOTE — Progress Notes (Signed)
Occupational Therapy Session Note  Patient Details  Name: Steven Blankenship MRN: [id number] Date of Birth: 07/19/1966  Today's Date: 12/11/2018 OT Individual Time: 7414-2395 OT Individual Time Calculation (min): 55 min    Short Term Goals: Week 1:  OT Short Term Goal 1 (Week 1): STG=LTG 2/2 ELOS  Skilled Therapeutic Interventions/Progress Updates:    Completed ADL retraining at overall Mod I - Independent level.  Pt completed bed mobility and ambulated around room without AD at Independent level to gather clothing prior to ambulating to bathroom for shower.  Pt completed toilet and walk-in shower transfers Mod I with use of grab bars.  Pt completed toileting at Independent level and bathing at Mod I at sit > stand level.  Pt completed dressing at sit > stand level even able to don air cast to Lt ankle without assistance.  Pt asking about d/c plan and wife called during therapy session.  Educated that PA will provide discharge instructions and paperwork prior to d/c.  Pt demonstrating good awareness during self-care tasks this session.  Therapy Documentation Precautions:  Precautions Precautions: Fall Required Braces or Orthoses: Splint/Cast Splint/Cast: L ankle air cast Restrictions Weight Bearing Restrictions: No LLE Weight Bearing: Weight bearing as tolerated Pain: Pain Assessment Pain Scale: 0-10 Pain Score: 0-No pain   Therapy/Group: Individual Therapy  Simonne Come 12/11/2018, 9:56 AM

## 2018-12-11 NOTE — Progress Notes (Signed)
Anticipates discharge home today, restful throughout shift,C-Pap on and off during night per patient request, self regulated, Cont sleep chart.

## 2018-12-11 NOTE — Progress Notes (Signed)
Pt discharged to home with family. Discharge instructions given via Reesa Chew PA.

## 2018-12-12 ENCOUNTER — Telehealth: Payer: Self-pay

## 2018-12-12 NOTE — Telephone Encounter (Signed)
Contacted patient to schedule hospital f/u appt. Appt scheduled 12/17/18 @ 1200.

## 2018-12-12 NOTE — Telephone Encounter (Signed)
Noted. Thanks.

## 2018-12-17 ENCOUNTER — Telehealth: Payer: Self-pay

## 2018-12-17 ENCOUNTER — Ambulatory Visit (INDEPENDENT_AMBULATORY_CARE_PROVIDER_SITE_OTHER): Payer: BLUE CROSS/BLUE SHIELD | Admitting: Family Medicine

## 2018-12-17 DIAGNOSIS — D62 Acute posthemorrhagic anemia: Secondary | ICD-10-CM | POA: Diagnosis not present

## 2018-12-17 DIAGNOSIS — T07XXXA Unspecified multiple injuries, initial encounter: Secondary | ICD-10-CM

## 2018-12-17 DIAGNOSIS — R740 Nonspecific elevation of levels of transaminase and lactic acid dehydrogenase [LDH]: Secondary | ICD-10-CM | POA: Diagnosis not present

## 2018-12-17 DIAGNOSIS — R7401 Elevation of levels of liver transaminase levels: Secondary | ICD-10-CM

## 2018-12-17 DIAGNOSIS — I10 Essential (primary) hypertension: Secondary | ICD-10-CM

## 2018-12-17 DIAGNOSIS — S069X0S Unspecified intracranial injury without loss of consciousness, sequela: Secondary | ICD-10-CM | POA: Diagnosis not present

## 2018-12-17 DIAGNOSIS — R7989 Other specified abnormal findings of blood chemistry: Secondary | ICD-10-CM

## 2018-12-17 DIAGNOSIS — R945 Abnormal results of liver function studies: Secondary | ICD-10-CM

## 2018-12-17 DIAGNOSIS — D649 Anemia, unspecified: Secondary | ICD-10-CM

## 2018-12-17 MED ORDER — ALBUTEROL SULFATE HFA 108 (90 BASE) MCG/ACT IN AERS: 1.0000 | INHALATION_SPRAY | Freq: Four times a day (QID) | RESPIRATORY_TRACT | 1 refills | Status: DC | PRN

## 2018-12-17 NOTE — Telephone Encounter (Signed)
Transitional Care call-Patient    1. Are you/is patient experiencing any problems since coming home? No Are there any questions regarding any aspect of care? No 2. Are there any questions regarding medications administration/dosing?No Are meds being taken as prescribed?Yes Patient should review meds with caller to confirm 3. Have there been any falls?No 4. Has Home Health been to the house and/or have they contacted you? Home exercise program If not, have you tried to contact them? Can we help you contact them? 5. Are bowels and bladder emptying properly?Yes Are there any unexpected incontinence issues? No If applicable, is patient following bowel/bladder programs? 6. Any fevers, problems with breathing, unexpected pain?No 7. Are there any skin problems or new areas of breakdown?No 8. Has the patient/family member arranged specialty MD follow up (ie cardiology/neurology/renal/surgical/etc)?Yes  Can we help arrange? 9. Does the patient need any other services or support that we can help arrange?No 10. Are caregivers following through as expected in assisting the patient?Yes 11. Has the patient quit smoking, drinking alcohol, or using drugs as recommended?Yes  Appointment time 9:20 arrive time 9:00 with Dr. Posey Pronto on February 04, 2019 [place]
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
## 2018-12-18 NOTE — Assessment & Plan Note (Signed)
Recheck labs pending.  

## 2018-12-18 NOTE — Assessment & Plan Note (Signed)
Recheck cbc pending.  No more bleeding noted by patient.

## 2018-12-18 NOTE — Assessment & Plan Note (Signed)
Discussed IS use, he can practice deep breathing even w/o IS device. His ankle pain and scalp hematoma are gradually better.  He'll update me as needed.

## 2018-12-18 NOTE — Assessment & Plan Note (Signed)
No change in meds at this point, recheck labs pending.

## 2018-12-18 NOTE — Assessment & Plan Note (Addendum)
Path/phys d/w pt.  Globally improving.  Would still expect some occ memory lapses, given muscle relaxer use, sleep deprivation, presumed concussion, etc.  D/w pt.  He is monitoring his sx and has family help/support at home.  Alert and normal conversation today.  He is gradually returning to baseline activity at home.  Discussed gradual inc in physical and mental exertion.

## 2018-12-25 ENCOUNTER — Other Ambulatory Visit: Payer: Self-pay

## 2018-12-25 ENCOUNTER — Other Ambulatory Visit (INDEPENDENT_AMBULATORY_CARE_PROVIDER_SITE_OTHER): Payer: BLUE CROSS/BLUE SHIELD

## 2018-12-25 DIAGNOSIS — D649 Anemia, unspecified: Secondary | ICD-10-CM

## 2018-12-25 DIAGNOSIS — I10 Essential (primary) hypertension: Secondary | ICD-10-CM | POA: Diagnosis not present

## 2018-12-25 DIAGNOSIS — R739 Hyperglycemia, unspecified: Secondary | ICD-10-CM | POA: Diagnosis not present

## 2018-12-25 DIAGNOSIS — R945 Abnormal results of liver function studies: Secondary | ICD-10-CM | POA: Diagnosis not present

## 2018-12-25 DIAGNOSIS — R7989 Other specified abnormal findings of blood chemistry: Secondary | ICD-10-CM

## 2018-12-25 LAB — COMPREHENSIVE METABOLIC PANEL
ALT: 27 U/L (ref 0–53)
AST: 17 U/L (ref 0–37)
Albumin: 4 g/dL (ref 3.5–5.2)
Alkaline Phosphatase: 151 U/L — ABNORMAL HIGH (ref 39–117)
BUN: 11 mg/dL (ref 6–23)
CO2: 30 mEq/L (ref 19–32)
Calcium: 9.7 mg/dL (ref 8.4–10.5)
Chloride: 101 mEq/L (ref 96–112)
Creatinine, Ser: 0.99 mg/dL (ref 0.40–1.50)
GFR: 79.5 mL/min (ref >60.00)
Glucose, Bld: 136 mg/dL — ABNORMAL HIGH (ref 70–99)
Potassium: 3.7 mEq/L (ref 3.5–5.1)
Sodium: 140 mEq/L (ref 135–145)
Total Bilirubin: 0.6 mg/dL (ref 0.2–1.2)
Total Protein: 7.1 g/dL (ref 6.0–8.3)

## 2018-12-25 LAB — CBC WITH DIFFERENTIAL/PLATELET
Basophils Absolute: 0 10*3/uL (ref 0.0–0.1)
Basophils Relative: 0.4 % (ref 0.0–3.0)
Eosinophils Absolute: 0.1 10*3/uL (ref 0.0–0.7)
Eosinophils Relative: 1.9 % (ref 0.0–5.0)
HCT: 39.5 % (ref 39.0–52.0)
Hemoglobin: 13.3 g/dL (ref 13.0–17.0)
Lymphocytes Relative: 32.8 % (ref 12.0–46.0)
Lymphs Abs: 1.5 10*3/uL (ref 0.7–4.0)
MCHC: 33.6 g/dL (ref 30.0–36.0)
MCV: 84.7 fl (ref 78.0–100.0)
Monocytes Absolute: 0.5 10*3/uL (ref 0.1–1.0)
Monocytes Relative: 10 % (ref 3.0–12.0)
Neutro Abs: 2.6 10*3/uL (ref 1.4–7.7)
Neutrophils Relative %: 54.9 % (ref 43.0–77.0)
Platelets: 284 10*3/uL (ref 150.0–400.0)
RBC: 4.66 Mil/uL (ref 4.22–5.81)
RDW: 14.9 % (ref 11.5–15.5)
WBC: 4.7 10*3/uL (ref 4.0–10.5)

## 2018-12-25 LAB — LIPID PANEL
Cholesterol: 164 mg/dL (ref 0–200)
HDL: 52.1 mg/dL (ref >39.00)
LDL Cholesterol: 88 mg/dL (ref 0–99)
NonHDL: 111.68
Total CHOL/HDL Ratio: 3
Triglycerides: 118 mg/dL (ref 0.0–149.0)
VLDL: 23.6 mg/dL (ref 0.0–40.0)

## 2018-12-25 LAB — HEMOGLOBIN A1C: Hgb A1c MFr Bld: 6.2 % (ref 4.6–6.5)

## 2018-12-27 ENCOUNTER — Other Ambulatory Visit: Payer: Self-pay | Admitting: Physical Medicine and Rehabilitation

## 2018-12-31 ENCOUNTER — Inpatient Hospital Stay: Payer: BLUE CROSS/BLUE SHIELD | Admitting: Registered Nurse

## 2018-12-31 ENCOUNTER — Other Ambulatory Visit: Payer: Self-pay | Admitting: Family Medicine

## 2018-12-31 DIAGNOSIS — R748 Abnormal levels of other serum enzymes: Secondary | ICD-10-CM

## 2019-01-07 ENCOUNTER — Other Ambulatory Visit: Payer: Self-pay | Admitting: Physical Medicine and Rehabilitation

## 2019-01-07 ENCOUNTER — Other Ambulatory Visit: Payer: Self-pay | Admitting: Family Medicine

## 2019-01-07 MED ORDER — AMLODIPINE BESYLATE 5 MG PO TABS: ORAL_TABLET | ORAL | 0 refills | Status: DC

## 2019-01-07 MED ORDER — LISINOPRIL 20 MG PO TABS: 20.0000 mg | ORAL_TABLET | Freq: Every day | ORAL | 0 refills | Status: DC

## 2019-01-07 MED ORDER — TRAMADOL HCL 50 MG PO TABS: 50.0000 mg | ORAL_TABLET | Freq: Four times a day (QID) | ORAL | 0 refills | Status: AC | PRN

## 2019-01-07 NOTE — Telephone Encounter (Addendum)
Per EMR, metoprolol already sent.   Sent lisinopril and amlodipine.  Okay to continue prn tramadol (rx sent) but please get update on patient situation with patient.  Thanks

## 2019-01-07 NOTE — Telephone Encounter (Signed)
Patient left a voicemail stating that he needs a refill on his blood pressure medication . Patient stated that he was given Tramadol while in the hospital and wants to know if Dr. Damita Dunnings can give him a refill on that? Tramadol is not on medication list.

## 2019-01-07 NOTE — Telephone Encounter (Signed)
See other refill note.  Thanks.

## 2019-01-07 NOTE — Addendum Note (Signed)
Addended by: Tonia Ghent on: 01/07/2019 10:08 PM   Modules accepted: Orders

## 2019-01-07 NOTE — Telephone Encounter (Signed)
Patient stated that he was given Tramadol while in the hospital and wants to know if Dr. Damita Dunnings can give him a refill on that? Tramadol is not on medication list.

## 2019-01-07 NOTE — Addendum Note (Signed)
Addended by: Tonia Ghent on: 01/07/2019 10:29 PM   Modules accepted: Orders

## 2019-01-08 NOTE — Telephone Encounter (Signed)
Agreed, thanks

## 2019-01-08 NOTE — Telephone Encounter (Signed)
See other phone note. Patient notified as instructed.

## 2019-01-08 NOTE — Telephone Encounter (Signed)
Patient notified by telephone that script has been sent to the pharmacy. Patient stated that he has been out of the tramadol for over a week and the pain has been waking him up in the mornings. Patient stated that he has been having a lot of pain in his chest and shoulder, but the swelling is going down. Patient stated that he has a follow-up appointment scheduled 02/04/19 for a hospital follow-up with Dr. Posey Pronto. Advised patient if he does not continue to improve to let Dr. Damita Dunnings know.

## 2019-01-21 ENCOUNTER — Other Ambulatory Visit (INDEPENDENT_AMBULATORY_CARE_PROVIDER_SITE_OTHER): Payer: BLUE CROSS/BLUE SHIELD

## 2019-01-21 DIAGNOSIS — R748 Abnormal levels of other serum enzymes: Secondary | ICD-10-CM

## 2019-01-23 LAB — ALKALINE PHOSPHATASE, ISOENZYMES
Alkaline Phosphatase: 98 IU/L (ref 39–117)
BONE FRACTION: 35 % (ref 12–68)
INTESTINAL FRAC.: 4 % (ref 0–18)
LIVER FRACTION: 61 % (ref 13–88)

## 2019-02-04 ENCOUNTER — Encounter: Payer: BLUE CROSS/BLUE SHIELD | Admitting: Physical Medicine & Rehabilitation

## 2019-02-04 ENCOUNTER — Inpatient Hospital Stay: Payer: BLUE CROSS/BLUE SHIELD | Admitting: Physical Medicine & Rehabilitation

## 2019-04-12 ENCOUNTER — Other Ambulatory Visit: Payer: Self-pay | Admitting: Family Medicine

## 2019-04-19 ENCOUNTER — Telehealth: Payer: Self-pay | Admitting: Family Medicine

## 2019-04-19 NOTE — Telephone Encounter (Signed)
Placed form in Dr. Duncan's inbox 

## 2019-04-19 NOTE — Telephone Encounter (Signed)
Pt dropped off Disability Parking Placard to be filled out by Dr. Damita Dunnings. Form placed in Rx tower for Dr. Damita Dunnings. Please call patient when form is ready to be picked up.  CB [phone]

## 2019-04-21 NOTE — Telephone Encounter (Signed)
Form done. Thanks. 

## 2019-04-22 NOTE — Telephone Encounter (Signed)
Patient advised. left at front desk for pick up.

## 2019-06-18 ENCOUNTER — Other Ambulatory Visit: Payer: Self-pay | Admitting: Family Medicine

## 2019-06-26 ENCOUNTER — Telehealth: Payer: Self-pay | Admitting: Family Medicine

## 2019-06-26 DIAGNOSIS — M25569 Pain in unspecified knee: Secondary | ICD-10-CM
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
## 2019-06-26 NOTE — Telephone Encounter (Signed)
I put in the referral.  Thanks.  

## 2019-06-26 NOTE — Telephone Encounter (Signed)
Called pt and let him know Dr. Damita Dunnings put in referral for him and to let us know if he hasn't been contacted in a week regarding the referral.

## 2019-09-20 ENCOUNTER — Other Ambulatory Visit: Payer: Self-pay | Admitting: Family Medicine

## 2019-09-27 ENCOUNTER — Encounter: Payer: Self-pay | Admitting: Family Medicine

## 2019-09-27 ENCOUNTER — Other Ambulatory Visit: Payer: Self-pay | Admitting: Family Medicine

## 2019-09-27 ENCOUNTER — Other Ambulatory Visit: Payer: Self-pay

## 2019-09-27 ENCOUNTER — Ambulatory Visit (INDEPENDENT_AMBULATORY_CARE_PROVIDER_SITE_OTHER): Payer: Self-pay | Admitting: Family Medicine

## 2019-09-27 VITALS — BP 148/94 | HR 104 | Temp 97.4°F | Wt 380.0 lb

## 2019-09-27 DIAGNOSIS — I10 Essential (primary) hypertension: Secondary | ICD-10-CM

## 2019-09-27 DIAGNOSIS — Z125 Encounter for screening for malignant neoplasm of prostate: Secondary | ICD-10-CM

## 2019-09-27 DIAGNOSIS — S069X0S Unspecified intracranial injury without loss of consciousness, sequela: Secondary | ICD-10-CM

## 2019-09-27 LAB — COMPREHENSIVE METABOLIC PANEL
ALT: 39 U/L (ref 0–53)
AST: 23 U/L (ref 0–37)
Albumin: 4.4 g/dL (ref 3.5–5.2)
Alkaline Phosphatase: 80 U/L (ref 39–117)
BUN: 10 mg/dL (ref 6–23)
CO2: 28 mEq/L (ref 19–32)
Calcium: 9.4 mg/dL (ref 8.4–10.5)
Chloride: 103 mEq/L (ref 96–112)
Creatinine, Ser: 1.04 mg/dL (ref 0.40–1.50)
GFR: 74.88 mL/min (ref >60.00)
Glucose, Bld: 105 mg/dL — ABNORMAL HIGH (ref 70–99)
Potassium: 3.5 mEq/L (ref 3.5–5.1)
Sodium: 141 mEq/L (ref 135–145)
Total Bilirubin: 0.6 mg/dL (ref 0.2–1.2)
Total Protein: 7.3 g/dL (ref 6.0–8.3)

## 2019-09-27 LAB — LIPID PANEL
Cholesterol: 176 mg/dL (ref 0–200)
HDL: 59.9 mg/dL (ref >39.00)
LDL Cholesterol: 102 mg/dL — ABNORMAL HIGH (ref 0–99)
NonHDL: 116.2
Total CHOL/HDL Ratio: 3
Triglycerides: 70 mg/dL (ref 0.0–149.0)
VLDL: 14 mg/dL (ref 0.0–40.0)

## 2019-09-27 LAB — PSA: PSA: 0.33 ng/mL (ref 0.10–4.00)

## 2019-09-27 MED ORDER — METOPROLOL TARTRATE 25 MG PO TABS: 25.0000 mg | ORAL_TABLET | Freq: Every day | ORAL | 3 refills | Status: AC

## 2019-09-27 MED ORDER — LISINOPRIL 20 MG PO TABS: 20.0000 mg | ORAL_TABLET | Freq: Every day | ORAL | 3 refills | Status: DC

## 2019-09-27 MED ORDER — AMLODIPINE BESYLATE 5 MG PO TABS: ORAL_TABLET | ORAL | 3 refills | Status: DC
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
## 2019-09-27 NOTE — Patient Instructions (Signed)
Take care.  Glad to see you. Go to the lab on the way out.   If you have mychart we'll likely use that to update you.    Update me as needed.

## 2019-09-27 NOTE — Telephone Encounter (Signed)
When does pt need to see you again?

## 2019-09-29 DIAGNOSIS — Z125 Encounter for screening for malignant neoplasm of prostate: Secondary | ICD-10-CM | POA: Insufficient documentation

## 2019-09-29 NOTE — Telephone Encounter (Signed)
D/w pt at [place], rx prev sent.  Thanks.

## 2019-09-29 NOTE — Assessment & Plan Note (Signed)
See notes on follow-up labs.  He will restart his home medications and then update me as needed.  Discussed diet and exercise.

## 2019-09-29 NOTE — Assessment & Plan Note (Addendum)
Prev TBI d/w pt.  "I think I'm doing well."  He is still putting up with ankle pain.  He is going to f/u with ortho about his ankle and I will defer for now.  He agrees.

## 2019-09-29 NOTE — Assessment & Plan Note (Signed)
Prostate cancer screening and PSA options (with potential risks and benefits of testing vs not testing) were discussed along with recent recs/guidelines.  He opted for testing PSA at this point.  See notes on labs.

## 2019-09-30 MED ORDER — AMLODIPINE BESYLATE 5 MG PO TABS: ORAL_TABLET | ORAL | 3 refills | Status: AC

## 2019-09-30 MED ORDER — LISINOPRIL 20 MG PO TABS: 20.0000 mg | ORAL_TABLET | Freq: Every day | ORAL | 3 refills | Status: AC

## 2019-09-30 NOTE — Addendum Note (Signed)
Addended by: Josetta Huddle on: 09/30/2019 10:02 AM   Modules accepted: Orders

## 2019-09-30 NOTE — Addendum Note (Signed)
Addended by: Josetta Huddle on: 09/30/2019 10:03 AM   Modules accepted: Orders

## 2019-11-20 ENCOUNTER — Encounter: Payer: Self-pay | Admitting: Internal Medicine

## 2019-11-20 ENCOUNTER — Ambulatory Visit (INDEPENDENT_AMBULATORY_CARE_PROVIDER_SITE_OTHER): Payer: Self-pay | Admitting: Internal Medicine

## 2019-11-20 ENCOUNTER — Other Ambulatory Visit: Payer: Self-pay

## 2019-11-20 VITALS — BP 144/94 | HR 92 | Temp 98.0°F | Wt 375.0 lb

## 2019-11-20 DIAGNOSIS — N6320 Unspecified lump in the left breast, unspecified quadrant: Secondary | ICD-10-CM

## 2019-11-20 NOTE — Progress Notes (Signed)
Subjective:    Patient ID: Steven Blankenship, male    DOB: [birth year], 54 y.o.   MRN: [id number]  HPI  Patient presents to the clinic today with complaint of a lump on his chest.  He noticed it this morning. It is not tender to touch. He denies redness, warmth or drainage from the area. He has not tried anything OTC for this.  Review of Systems      Past Medical History:  Diagnosis Date  . Hypertension   . OSA (obstructive sleep apnea)    on CPAP    Current Outpatient Medications  Medication Sig Dispense Refill  . amLODipine (NORVASC) 5 MG tablet Take 1 tablet by mouth once daily 90 tablet 3  . lisinopril (ZESTRIL) 20 MG tablet Take 1 tablet (20 mg total) by mouth daily. 90 tablet 3  . metoprolol tartrate (LOPRESSOR) 25 MG tablet Take 1 tablet (25 mg total) by mouth daily. 90 tablet 3   No current facility-administered medications for this visit.    No Known Allergies  Family History  Problem Relation Age of Onset  . Hypertension Father   . Stroke Father   . Deep vein thrombosis Brother   . Steven cancer Neg Hx   . Prostate cancer Neg Hx     Social History   Socioeconomic History  . Marital status: Married    Spouse name: Not on file  . Number of children: Not on file  . Years of education: Not on file  . Highest education level: Not on file  Occupational History  . Occupation: Proofreader   Tobacco Use  . Smoking status: Never Smoker  . Smokeless tobacco: Never Used  Substance and Sexual Activity  . Alcohol use: Yes    Alcohol/week: 2.0 standard drinks    Types: 1 Glasses of wine, 1 Shots of liquor per week    Comment: occasional  . Drug use: No  . Sexual activity: Not Currently  Other Topics Concern  . Not on file  Social History Narrative   From [place]   To [place] '86   [employer] [place], [place] arena football after college.     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     Enjoys fishing and riding his motorcycle.     Social Determinants of Health   Financial Resource Strain: Medium Risk  . Difficulty of Paying Living Expenses: Somewhat hard  Food Insecurity: No Food Insecurity  . Worried About Charity fundraiser in the Last Year: Never true  . Ran Out of Food in the Last Year: Never true  Transportation Needs: No Transportation Needs  . Lack of Transportation (Medical): No  . Lack of Transportation (Non-Medical): No  Physical Activity: Unknown  . Days of Exercise per Week: Patient refused  . Minutes of Exercise per Session: Patient refused  Stress:   . Feeling of Stress :   Social Connections: Unknown  . Frequency of Communication with Friends and Family: Patient refused  . Frequency of Social Gatherings with Friends and Family: Patient refused  . Attends Religious Services: Patient refused  . Active Member of Clubs or Organizations: Patient refused  . Attends Archivist Meetings: Patient refused  . Marital Status: Patient refused  Intimate Partner Violence: Unknown  . Fear of Current or Ex-Partner: Patient  refused  . Emotionally Abused: Patient refused  . Physically Abused: Patient refused  . Sexually Abused: Patient refused     Constitutional: Denies fever, malaise, fatigue, headache or abrupt weight changes.  Respiratory: Denies difficulty breathing, shortness of breath, cough or sputum production.   Cardiovascular: Denies chest pain, chest tightness, palpitations or swelling in the hands or feet.  Skin: Patient reports lump of chest.  Denies redness, rashes, or ulcercations.    No other specific complaints in a complete review of systems (except as listed in HPI above).  Objective:   Physical Exam  BP (!) 144/94   Pulse 92   Temp 98 F (36.7 C) (Temporal)   Wt (!) 375 lb (170.1 kg)   SpO2 97%   BMI 45.65 kg/m    Wt Readings from Last 3 Encounters:    09/27/19 (!) 380 lb (172.4 kg)  12/08/18 (!) 369 lb 4.3 oz (167.5 kg)  12/03/18 300 lb (136.1 kg)    General: Appears his stated age, obese, in NAD. Skin: Warm, dry and intact. 2 in x 3 in hard, nonmobile mass noted 11 oclock, left breast. No nipple discharge or axillary lymphadenopathy noted. Cardiovascular: Normal rate and rhythm.  Pulmonary/Chest: Normal effort and positive vesicular breath sounds. No respiratory distress. No wheezes, rales or ronchi noted.  Neurological: Alert and oriented.    BMET    Component Value Date/Time   NA 141 09/27/2019 1320   K 3.5 09/27/2019 1320   CL 103 09/27/2019 1320   CO2 28 09/27/2019 1320   GLUCOSE 105 (H) 09/27/2019 1320   BUN 10 09/27/2019 1320   CREATININE 1.04 09/27/2019 1320   CALCIUM 9.4 09/27/2019 1320   CALCIUM 9.7 08/14/2008 2300   GFRNONAA >60 12/08/2018 0511   GFRAA >60 12/08/2018 0511    Lipid Panel     Component Value Date/Time   CHOL 176 09/27/2019 1320   TRIG 70.0 09/27/2019 1320   HDL 59.90 09/27/2019 1320   CHOLHDL 3 09/27/2019 1320   VLDL 14.0 09/27/2019 1320   LDLCALC 102 (H) 09/27/2019 1320    CBC    Component Value Date/Time   WBC 4.7 12/25/2018 1016   RBC 4.66 12/25/2018 1016   HGB 13.3 12/25/2018 1016   HCT 39.5 12/25/2018 1016   PLT 284.0 12/25/2018 1016   MCV 84.7 12/25/2018 1016   MCH 27.0 12/08/2018 0511   MCHC 33.6 12/25/2018 1016   RDW 14.9 12/25/2018 1016   LYMPHSABS 1.5 12/25/2018 1016   MONOABS 0.5 12/25/2018 1016   EOSABS 0.1 12/25/2018 1016   BASOSABS 0.0 12/25/2018 1016    Hgb A1C Lab Results  Component Value Date   HGBA1C 6.2 12/25/2018            Assessment & Plan:   Left Breast Mass:  Will obtain diagnostic mammogram and ultrasound of the left breast  Will follow up after imaging results back, return precautions discussed Webb Silversmith, NP This visit occurred during the SARS-CoV-2 public health emergency.  Safety protocols were in place, including screening  questions prior to the visit, additional usage of staff PPE, and extensive cleaning of exam room while observing appropriate contact time as indicated for disinfecting solutions.

## 2019-11-20 NOTE — Patient Instructions (Signed)
Breast Scan A breast scan is an imaging test that is done to examine dense breast tissue. A breast scan is done using a radioactive material that produces images of the breast. A breast scan is used in people who have breast lesions that resulted from:  Rope-like, lumpy tissue (fibrocystic disease).  Solid, painless lumps (fibroadenoma).  Damaged fatty breast tissue (fat necrosis). A breast scan may also be done to find out the best treatment for people who have breast cancer. Tell a health care provider about:  Any allergies you have.  All medicines you are taking, including vitamins, herbs, eye drops, creams, and over-the-counter medicines.  Any problems you or family members have had with anesthetic medicines.  Any blood disorders you have.  Any surgeries you have had.  Any medical conditions you have.  Whether you are pregnant or may be pregnant, if this applies.  Whether you are breastfeeding, if this applies. What are the risks? Generally, this is a safe procedure. However, problems may occur, including:  Slight discomfort from injection of a radioactive substance.  Allergic reaction to a contrast or radioactive substance used during the procedure. What happens before the procedure?  Ask your health care provider about changing or stopping your regular medicines. This is especially important if you are taking diabetes medicines or blood thinners. What happens during the procedure?  You will be asked to remove all jewelry and clothing from the waist up.  An IV tube will be inserted into one of your veins.  You will be asked to lie face-down on a table. The breast that will be scanned will be placed through an opening in the table. You may also be asked to get into different positions during the scan.  The radioactive agent will be injected into the IV tube. You may have a slight metallic taste after the injection.  A scanner will be placed over the breast to record  the radiation, which produces an image of the breast.  The procedure may be repeated on the other breast.  When the scan is complete, the IV tube will be removed. The procedure may vary among health care providers and hospitals. What happens after the procedure?   You will be asked to get up slowly. This helps you avoid light-headedness after lying flat during the procedure.  Drink enough fluid to keep your urine clear or pale yellow. This helps to wash (flush) the remaining radioactive agent out of your body.  It is up to you to get the results of your procedure. Ask your health care provider, or the department that is doing the procedure, when your results will be ready. Summary  A breast scan is an imaging test that looks at breast lesions. A breast scan may also be done to find out the best treatment for people who have breast cancer.  A radioactive substance is injected through an IV tube, and pictures are taken of the breasts.  After the procedure, drink enough fluid to keep your urine clear or pale yellow. This helps to wash (flush) the remaining radioactive agent out of your body. This information is not intended to replace advice given to you by your health care provider. Make sure you discuss any questions you have with your health care provider. Document Revised: 10/12/2018 Document Reviewed: 04/19/2016 Elsevier Patient Education  [place].

## 2019-11-25 ENCOUNTER — Telehealth: Payer: Self-pay

## 2019-11-25 DIAGNOSIS — N6320 Unspecified lump in the left breast, unspecified quadrant: Secondary | ICD-10-CM

## 2019-11-25 NOTE — Telephone Encounter (Signed)
done

## 2019-11-25 NOTE — Addendum Note (Signed)
Addended by: Jearld Fenton on: 11/25/2019 01:32 PM   Modules accepted: Orders

## 2019-11-25 NOTE — Telephone Encounter (Signed)
Per [place], please change pt's orders for dx mammo to 3D Tomo.  Need order for IMG [id number] and IMG [id number].

## 2019-12-02 ENCOUNTER — Ambulatory Visit
Admission: RE | Admit: 2019-12-02 | Discharge: 2019-12-02 | Disposition: A | Discharge status: Home / Self Care | Payer: Self-pay | Source: Ambulatory Visit | Attending: Internal Medicine | Admitting: Internal Medicine

## 2019-12-02 DIAGNOSIS — N6320 Unspecified lump in the left breast, unspecified quadrant: Secondary | ICD-10-CM | POA: Insufficient documentation
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
## 2020-01-20 ENCOUNTER — Other Ambulatory Visit: Payer: Self-pay

## 2020-01-20 ENCOUNTER — Ambulatory Visit (INDEPENDENT_AMBULATORY_CARE_PROVIDER_SITE_OTHER): Payer: BLUE CROSS/BLUE SHIELD | Admitting: Family Medicine

## 2020-01-20 ENCOUNTER — Encounter: Payer: Self-pay | Admitting: Family Medicine

## 2020-01-20 VITALS — BP 140/100 | HR 78 | Temp 98.2°F | Ht 73.5 in | Wt 375.5 lb

## 2020-01-20 DIAGNOSIS — M25572 Pain in left ankle and joints of left foot: Secondary | ICD-10-CM | POA: Diagnosis not present

## 2020-01-20 DIAGNOSIS — M6788 Other specified disorders of synovium and tendon, other site: Secondary | ICD-10-CM | POA: Diagnosis not present

## 2020-01-20 DIAGNOSIS — Z6841 Body Mass Index (BMI) 40.0 and over, adult: Secondary | ICD-10-CM

## 2020-01-20 DIAGNOSIS — M76822 Posterior tibial tendinitis, left leg: Secondary | ICD-10-CM | POA: Diagnosis not present

## 2020-01-20 MED ORDER — PREDNISONE 20 MG PO TABS: ORAL_TABLET | ORAL | 0 refills | Status: DC

## 2020-01-20 NOTE — Patient Instructions (Signed)
Posterior Tib and arch rehab  Towel "Scrunch Ups" Use a hand towel or a moderate size towel Foot flat down on the towel Use toes to "scrunch up the towel" straight up and down, and going to the right and left.  3 sets of 20 * Can be done watching TV, reading, or sitting and relaxing.   Balance Exercises:  While brushing teeth, practice standing on 1 foot. Do for as long as you can, ideally 30 seconds to 1 minute each foot.

## 2020-02-17 ENCOUNTER — Encounter: Payer: Self-pay | Admitting: Family Medicine

## 2020-02-17 ENCOUNTER — Other Ambulatory Visit: Payer: Self-pay

## 2020-02-17 ENCOUNTER — Ambulatory Visit: Payer: BLUE CROSS/BLUE SHIELD | Admitting: Family Medicine

## 2020-02-17 VITALS — BP 130/80 | HR 71 | Temp 97.9°F | Ht 73.5 in | Wt 383.0 lb

## 2020-02-17 DIAGNOSIS — M76822 Posterior tibial tendinitis, left leg: Secondary | ICD-10-CM

## 2020-02-17 DIAGNOSIS — Z6841 Body Mass Index (BMI) 40.0 and over, adult: Secondary | ICD-10-CM

## 2020-02-17 DIAGNOSIS — M6788 Other specified disorders of synovium and tendon, other site: Secondary | ICD-10-CM

## 2020-02-17 MED ORDER — CELECOXIB 200 MG PO CAPS: 200.0000 mg | ORAL_CAPSULE | Freq: Every day | ORAL | 2 refills | Status: AC

## 2020-02-17 NOTE — Patient Instructions (Addendum)
Volaren 1% gel. Over the counter You can apply up to 4 times a day Minimal is absorbed in the bloodstream Cost is about 9 dollars    Motion control shoes Go to the [employer] at [employer] - in the back they have a lot of wide shoes, new balance, etc.
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
## 2020-11-03 DEATH — deceased
